# Patient Record
Sex: Female | Born: 1965 | Race: Black or African American | Hispanic: No | Marital: Married | Smoking: Never smoker
Health system: Southern US, Community
[De-identification: ages and names within clinical notes are randomized; demographics above are authoritative.]

## PROBLEM LIST (undated history)

## (undated) DIAGNOSIS — I10 Essential (primary) hypertension: Secondary | ICD-10-CM

## (undated) HISTORY — DX: Essential (primary) hypertension: I10

---

## 1999-06-07 HISTORY — PX: OTHER SURGICAL HISTORY: SHX169

## 2018-08-09 ENCOUNTER — Emergency Department (HOSPITAL_COMMUNITY)
Admission: EM | Admit: 2018-08-09 | Discharge: 2018-08-09 | Disposition: A | Discharge status: Home / Self Care | Payer: 59 | Attending: Emergency Medicine | Admitting: Emergency Medicine

## 2018-08-09 ENCOUNTER — Encounter (HOSPITAL_COMMUNITY): Payer: Self-pay

## 2018-08-09 ENCOUNTER — Other Ambulatory Visit: Payer: Self-pay

## 2018-08-09 ENCOUNTER — Emergency Department (HOSPITAL_COMMUNITY): Payer: 59

## 2018-08-09 DIAGNOSIS — D509 Iron deficiency anemia, unspecified: Secondary | ICD-10-CM

## 2018-08-09 DIAGNOSIS — I160 Hypertensive urgency: Secondary | ICD-10-CM | POA: Diagnosis not present

## 2018-08-09 DIAGNOSIS — D649 Anemia, unspecified: Secondary | ICD-10-CM | POA: Insufficient documentation

## 2018-08-09 DIAGNOSIS — R51 Headache: Secondary | ICD-10-CM | POA: Diagnosis present

## 2018-08-09 DIAGNOSIS — R202 Paresthesia of skin: Secondary | ICD-10-CM

## 2018-08-09 LAB — CBC WITH DIFFERENTIAL/PLATELET
Abs Immature Granulocytes: 0.01 10*3/uL (ref 0.00–0.07)
BASOS PCT: 1 %
Basophils Absolute: 0.1 10*3/uL (ref 0.0–0.1)
Eosinophils Absolute: 0.1 10*3/uL (ref 0.0–0.5)
Eosinophils Relative: 1 %
HCT: 26 % — ABNORMAL LOW (ref 36.0–46.0)
Hemoglobin: 7.2 g/dL — ABNORMAL LOW (ref 12.0–15.0)
Immature Granulocytes: 0 %
Lymphocytes Relative: 24 %
Lymphs Abs: 1.2 10*3/uL (ref 0.7–4.0)
MCH: 19 pg — ABNORMAL LOW (ref 26.0–34.0)
MCHC: 27.7 g/dL — ABNORMAL LOW (ref 30.0–36.0)
MCV: 68.8 fL — AB (ref 80.0–100.0)
Monocytes Absolute: 0.4 10*3/uL (ref 0.1–1.0)
Monocytes Relative: 8 %
Neutro Abs: 3.3 10*3/uL (ref 1.7–7.7)
Neutrophils Relative %: 66 %
Platelets: 428 10*3/uL — ABNORMAL HIGH (ref 150–400)
RBC: 3.78 MIL/uL — ABNORMAL LOW (ref 3.87–5.11)
RDW: 21.2 % — ABNORMAL HIGH (ref 11.5–15.5)
WBC: 5 10*3/uL (ref 4.0–10.5)
nRBC: 0 % (ref 0.0–0.2)

## 2018-08-09 LAB — COMPREHENSIVE METABOLIC PANEL
ALT: 10 U/L (ref 0–44)
AST: 19 U/L (ref 15–41)
Albumin: 3.1 g/dL — ABNORMAL LOW (ref 3.5–5.0)
Alkaline Phosphatase: 69 U/L (ref 38–126)
Anion gap: 4 — ABNORMAL LOW (ref 5–15)
BUN: 8 mg/dL (ref 6–20)
CHLORIDE: 110 mmol/L (ref 98–111)
CO2: 23 mmol/L (ref 22–32)
CREATININE: 0.74 mg/dL (ref 0.44–1.00)
Calcium: 8.6 mg/dL — ABNORMAL LOW (ref 8.9–10.3)
GFR calc Af Amer: >60 mL/min (ref >60)
Glucose, Bld: 100 mg/dL — ABNORMAL HIGH (ref 70–99)
Potassium: 3.4 mmol/L — ABNORMAL LOW (ref 3.5–5.1)
Sodium: 137 mmol/L (ref 135–145)
Total Bilirubin: 0.2 mg/dL — ABNORMAL LOW (ref 0.3–1.2)
Total Protein: 7.2 g/dL (ref 6.5–8.1)

## 2018-08-09 LAB — I-STAT TROPONIN, ED: Troponin i, poc: 0 ng/mL (ref 0.00–0.08)

## 2018-08-09 MED ORDER — ONDANSETRON HCL 4 MG/2ML IJ SOLN
4.0000 mg | Freq: Once | INTRAMUSCULAR | Status: AC
  Administered 2018-08-09: 4 mg via INTRAVENOUS
  Filled 2018-08-09: qty 2

## 2018-08-09 MED ORDER — LABETALOL HCL 5 MG/ML IV SOLN
10.0000 mg | Freq: Once | INTRAVENOUS | Status: AC
  Administered 2018-08-09: 10 mg via INTRAVENOUS
  Filled 2018-08-09: qty 4

## 2018-08-09 MED ORDER — MORPHINE SULFATE (PF) 4 MG/ML IV SOLN
4.0000 mg | Freq: Once | INTRAVENOUS | Status: AC
  Administered 2018-08-09: 4 mg via INTRAVENOUS
  Filled 2018-08-09: qty 1

## 2018-08-09 MED ORDER — FERROUS SULFATE 325 (65 FE) MG PO TABS: 325.0000 mg | ORAL_TABLET | Freq: Every day | ORAL | 0 refills | Status: AC

## 2018-08-09 MED ORDER — AMLODIPINE BESYLATE 5 MG PO TABS: 5.0000 mg | ORAL_TABLET | Freq: Every day | ORAL | 0 refills | Status: DC

## 2018-08-09 MED ORDER — HYDROCHLOROTHIAZIDE 25 MG PO TABS: 25.0000 mg | ORAL_TABLET | Freq: Every day | ORAL | 0 refills | Status: DC

## 2018-08-09 NOTE — ED Notes (Signed)
Patient verbalizes understanding of discharge instructions. Opportunity for questioning and answering were provided. Armband removed by staff , patient discharged from ED. 

## 2018-08-09 NOTE — ED Triage Notes (Addendum)
Pt brought in by ems for c/o sudden onset of right sided tingling that began around 0845 am today ; pt states it lasted about 30 min and went away ; pt now c/o right sided wrist pain ; pt denies any trauma to the area upon ems arrival , pt was hypertensive at 262/142 ; pt states she has never had high BP and doesn't take anything for it ; pt alert and oriented x 4 ; grips strong and equal; pt denies any further tingling

## 2018-08-09 NOTE — ED Notes (Addendum)
Greta Doom, PA aware og 246/164 BP and patient not wanting fecal occult ; per bowie , awaiting Ct scan and then will re evaluate Bp after CT scan; no further orders received at this time

## 2018-08-09 NOTE — ED Provider Notes (Signed)
[facility] EMERGENCY DEPARTMENT Provider Note   CSN: [id number] Arrival date & time: 08/09/18  1004    History   Chief Complaint Chief Complaint  Patient presents with  . Hypertension    HPI Savannah Randall is a 53 y.o. female.     The history is provided by the patient and the EMS personnel. No language interpreter was used.  Hypertension      53 year old female without any significant past medical history presenting via EMS from home for evaluation of right-sided tingling sensation.  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  She also noticed in sensation to her tongue as well.  This is new.  Patient pulled off on the side of the road and contacted EMS.  Her tingling sensation lasting for approximately 30 minutes and when EMS arrived, she was found to have high blood pressure at 262/142.  No history of hypertension in the past.  She felt that she was worked up from the tingling sensation which caused her high blood pressure.  This sensation has mostly resolved and now she is complaining of pain to her right wrist.  She denies any headache, vision changes, confusion, neck pain, chest pain, trouble breathing, abdominal pain, back pain denies any focal weakness.  She denies any increasing stress.  She denies alcohol or drug tobacco abuse.  She denies any recent injury.  No prior history of stroke.  History reviewed. No pertinent past medical history.  There are no active problems to display for this patient.   Past Surgical History:  Procedure Laterality Date  . 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      Home Medications    Prior to Admission medications   Not on File    Family History No family history on file.  Social History Social  History   Tobacco Use  . Smoking status: Never Smoker  . Smokeless tobacco: Never Used  Substance Use Topics  . Alcohol use: Not Currently  . Drug use: Never     Allergies   Benadryl allergy [diphenhydramine hcl]   Review of Systems Review of Systems  All other systems reviewed and are negative.    Physical Exam Updated Vital Signs BP (!) 195/114   Pulse 78   Temp 98 F (36.7 C) (Oral)   Resp 18   Ht 5\' 2"  (1.575 m)   Wt 61.2 kg   SpO2 98%   BMI 24.69 kg/m   Physical Exam Vitals signs and nursing note reviewed.  Constitutional:      General: She is not in acute distress.    Appearance: She is well-developed.  HENT:     Head: Normocephalic and atraumatic.     Nose: Nose normal.     Mouth/Throat:     Mouth: Mucous membranes are moist.  Eyes:     Extraocular Movements: Extraocular movements intact.     Conjunctiva/sclera: Conjunctivae normal.     Pupils: Pupils are equal, round, and reactive to light.  Neck:     Musculoskeletal: Normal range of motion and neck supple.  Cardiovascular:     Rate and Rhythm: Normal rate and regular rhythm.     Pulses: Normal pulses.     Heart sounds: Normal  heart sounds.  Pulmonary:     Breath sounds: Normal breath sounds. No wheezing, rhonchi or rales.  Abdominal:     Palpations: Abdomen is soft.     Tenderness: There is no abdominal tenderness.  Musculoskeletal:     Comments: 5 out of 5 strength all 4 extremities with intact sensation.  Mild tenderness when palpate right wrist with normal wrist flexion extension supination and pronation radial pulse 2+ brisk cap refill distally.  Skin:    Capillary Refill: Capillary refill takes less than 2 seconds.     Findings: No rash.  Neurological:     Mental Status: She is alert and oriented to person, place, and time.     GCS: GCS eye subscore is 4. GCS verbal subscore is 5. GCS motor subscore is 6.     Cranial Nerves: Cranial nerves are intact.     Sensory: Sensation is intact.       Motor: Motor function is intact.     Coordination: Coordination is intact.     Gait: Gait is intact.      ED Treatments / Results  Labs (all labs ordered are listed, but only abnormal results are displayed) Labs Reviewed  CBC WITH DIFFERENTIAL/PLATELET - Abnormal; Notable for the following components:      Result Value   RBC 3.78 (*)    Hemoglobin 7.2 (*)    HCT 26.0 (*)    MCV 68.8 (*)    MCH 19.0 (*)    MCHC 27.7 (*)    RDW 21.2 (*)    Platelets 428 (*)    All other components within normal limits  COMPREHENSIVE METABOLIC PANEL - Abnormal; Notable for the following components:   Potassium 3.4 (*)    Glucose, Bld 100 (*)    Calcium 8.6 (*)    Albumin 3.1 (*)    Total Bilirubin 0.2 (*)    Anion gap 4 (*)    All other components within normal limits  I-STAT TROPONIN, ED  POC OCCULT BLOOD, ED    EKG EKG Interpretation  Date/Time:  Thursday August 09 2018 10:12:59 EST Ventricular Rate:  79 PR Interval:    QRS Duration: 100 QT Interval:  402 QTC Calculation: 461 R Axis:   27 Text Interpretation:  Sinus rhythm Nonspecific T abnormalities, anterior leads No old tracing to compare Confirmed by Benjiman CorePickering, Nathan [phone]) on 08/09/2018 10:43:35 AM   Radiology Ct Head Wo Contrast  Result Date: 08/09/2018 CLINICAL DATA:  Sudden onset of RIGHT-sided tingling at 0845 hours today lasting 30 minutes then went away, now complaining of RIGHT wrist pain, hemiplegia EXAM: CT HEAD WITHOUT CONTRAST TECHNIQUE: Contiguous axial images were obtained from the base of the skull through the vertex without intravenous contrast. COMPARISON:  None FINDINGS: Brain: Cavum septum pellucidum. Otherwise normal ventricular morphology. No midline shift or mass effect. Normal appearance of brain parenchyma. No intracranial hemorrhage, mass lesion or evidence of acute infarction. No extra-axial fluid collections. Vascular: Unremarkable Skull: Intact Sinuses/Orbits: Clear Other: N/A IMPRESSION: No acute  intracranial abnormalities. Electronically Signed   By: Ulyses SouthwardMark  Boles M.D.   On: 08/09/2018 13:00    Procedures .Critical Care Performed by: Fayrene Helperran, Thadius Smisek, PA-C Authorized by: Fayrene Helperran, Jarriel Papillion, PA-C   Critical care provider statement:    Critical care time (minutes):  45   Critical care was time spent personally by me on the following activities:  Discussions with consultants, evaluation of patient's response to treatment, examination of patient, ordering and performing treatments and interventions, ordering and  review of laboratory studies, ordering and review of radiographic studies, pulse oximetry, re-evaluation of patient's condition, obtaining history from patient or surrogate and review of old charts   (including critical care time)  Medications Ordered in ED Medications  morphine 4 MG/ML injection 4 mg (4 mg Intravenous Given 08/09/18 1146)  ondansetron (ZOFRAN) injection 4 mg (4 mg Intravenous Given 08/09/18 1146)  labetalol (NORMODYNE,TRANDATE) injection 10 mg (10 mg Intravenous Given 08/09/18 1154)     Initial Impression / Assessment and Plan / ED Course  I have reviewed the triage vital signs and the nursing notes.  Pertinent labs & imaging results that were available during my care of the patient were reviewed by me and considered in my medical decision making (see chart for details).        BP (!) 144/98   Pulse 68   Temp 98 F (36.7 C) (Oral)   Resp 16   Ht 5\' 2"  (1.575 m)   Wt 61.2 kg   SpO2 100%   BMI 24.69 kg/m    Final Clinical Impressions(s) / ED Diagnoses   Final diagnoses:  Tingling in extremities  Hypertensive urgency  Iron deficiency anemia, unspecified iron deficiency anemia type    ED Discharge Orders         Ordered    hydrochlorothiazide (HYDRODIURIL) 25 MG tablet  Daily     08/09/18 1403    ferrous sulfate 325 (65 FE) MG tablet  Daily     08/09/18 1403         10:39 AM Patient report episode of tingling sensation to her right arm and right  leg lasting for approximately 30 minutes.  Currently she does not have any focal neuro deficit on exam, sensation is intact throughout, tenderness about the right wrist without any signs of injury.  Her initial blood pressure here is 195/114, on recheck it was 165 systolic without any specific treatment.  Patient otherwise well-appearing.  Work-up initiated.  1:57 PM Blood pressure has been fluctuating but did improve after the patient received labetalol.  Hemoglobin is 7.2, patient states she does have a known history of iron deficiency anemia and having been taking her iron supplementation as prescribed.  She denies any GI bleeding.  Offered Hemoccult with patient declined.  Her head CT scan is unremarkable, EKG and troponin unremarkable, normal renal function, and electrolyte panels are otherwise reassuring.  At this time, her symptoms mostly resolved, mild tenderness to right wrist only.  Patient will follow-up with PCP for further management.  Resources provided.  Blood pressure medication provided as well.  She does admits to previous history of hypertension and was on lisinopril but states that it causes her blood pressure to drop and therefore she now no longer take blood pressure medication.  Highest blood pressure today was 264/140, which has improved. Will d/c home with Norvasc 5mg  daily.  She will need close f/u with PCP. Care discussed with Dr. Rubin Payor.    Fayrene Helper, PA-C 08/09/18 1411    Benjiman Core, MD 08/09/18 [phone]

## 2018-08-09 NOTE — Discharge Instructions (Signed)
You have been evaluated for your tingling sensation.  Your head CT is normal.  You have low hemoglobin of 7.2. Please take iron supplementation and use number below to find a primary care provider for further management of your health.  Your blood pressure is high today, take blood pressure medication as prescribed.

## 2018-08-13 ENCOUNTER — Ambulatory Visit: Payer: 59 | Admitting: Family Medicine

## 2018-08-13 ENCOUNTER — Other Ambulatory Visit: Payer: Self-pay

## 2018-08-13 ENCOUNTER — Encounter: Payer: Self-pay | Admitting: Family Medicine

## 2018-08-13 DIAGNOSIS — I10 Essential (primary) hypertension: Secondary | ICD-10-CM | POA: Diagnosis not present

## 2018-08-13 DIAGNOSIS — D649 Anemia, unspecified: Secondary | ICD-10-CM

## 2018-08-13 MED ORDER — AMLODIPINE BESYLATE 5 MG PO TABS: 5.0000 mg | ORAL_TABLET | Freq: Every day | ORAL | 0 refills | Status: DC

## 2018-08-13 NOTE — Assessment & Plan Note (Signed)
-  taking iron supplement, continue and recheck in 8 weeks -Will need to be sure she has had colon cancer screening at f/u visit.

## 2018-08-13 NOTE — Patient Instructions (Addendum)
Continue amlodipine but try switching to taking in the evening.   Hypertension Hypertension, commonly called high blood pressure, is when the force of blood pumping through the arteries is too strong. The arteries are the blood vessels that carry blood from the heart throughout the body. Hypertension forces the heart to work harder to pump blood and may cause arteries to become narrow or stiff. Having untreated or uncontrolled hypertension can cause heart attacks, strokes, kidney disease, and other problems. A blood pressure reading consists of a higher number over a lower number. Ideally, your blood pressure should be below 120/80. The first ("top") number is called the systolic pressure. It is a measure of the pressure in your arteries as your heart beats. The second ("bottom") number is called the diastolic pressure. It is a measure of the pressure in your arteries as the heart relaxes. What are the causes? The cause of this condition is not known. What increases the risk? Some risk factors for high blood pressure are under your control. Others are not. Factors you can change  Smoking.  Having type 2 diabetes mellitus, high cholesterol, or both.  Not getting enough exercise or physical activity.  Being overweight.  Having too much fat, sugar, calories, or salt (sodium) in your diet.  Drinking too much alcohol. Factors that are difficult or impossible to change  Having chronic kidney disease.  Having a family history of high blood pressure.  Age. Risk increases with age.  Race. You may be at higher risk if you are African-American.  Gender. Men are at higher risk than women before age 29. After age 12, women are at higher risk than men.  Having obstructive sleep apnea.  Stress. What are the signs or symptoms? Extremely high blood pressure (hypertensive crisis) may cause:  Headache.  Anxiety.  Shortness of breath.  Nosebleed.  Nausea and vomiting.  Severe chest  pain.  Jerky movements you cannot control (seizures). How is this diagnosed? This condition is diagnosed by measuring your blood pressure while you are seated, with your arm resting on a surface. The cuff of the blood pressure monitor will be placed directly against the skin of your upper arm at the level of your heart. It should be measured at least twice using the same arm. Certain conditions can cause a difference in blood pressure between your right and left arms. Certain factors can cause blood pressure readings to be lower or higher than normal (elevated) for a short period of time:  When your blood pressure is higher when you are in a health care provider's office than when you are at home, this is called white coat hypertension. Most people with this condition do not need medicines.  When your blood pressure is higher at home than when you are in a health care provider's office, this is called masked hypertension. Most people with this condition may need medicines to control blood pressure. If you have a high blood pressure reading during one visit or you have normal blood pressure with other risk factors:  You may be asked to return on a different day to have your blood pressure checked again.  You may be asked to monitor your blood pressure at home for 1 week or longer. If you are diagnosed with hypertension, you may have other blood or imaging tests to help your health care provider understand your overall risk for other conditions. How is this treated? This condition is treated by making healthy lifestyle changes, such as eating healthy  foods, exercising more, and reducing your alcohol intake. Your health care provider may prescribe medicine if lifestyle changes are not enough to get your blood pressure under control, and if:  Your systolic blood pressure is above 130.  Your diastolic blood pressure is above 80. Your personal target blood pressure may vary depending on your medical  conditions, your age, and other factors. Follow these instructions at home: Eating and drinking   Eat a diet that is high in fiber and potassium, and low in sodium, added sugar, and fat. An example eating plan is called the DASH (Dietary Approaches to Stop Hypertension) diet. To eat this way: ? Eat plenty of fresh fruits and vegetables. Try to fill half of your plate at each meal with fruits and vegetables. ? Eat whole grains, such as whole wheat pasta, brown rice, or whole grain bread. Fill about one quarter of your plate with whole grains. ? Eat or drink low-fat dairy products, such as skim milk or low-fat yogurt. ? Avoid fatty cuts of meat, processed or cured meats, and poultry with skin. Fill about one quarter of your plate with lean proteins, such as fish, chicken without skin, beans, eggs, and tofu. ? Avoid premade and processed foods. These tend to be higher in sodium, added sugar, and fat.  Reduce your daily sodium intake. Most people with hypertension should eat less than 1,500 mg of sodium a day.  Limit alcohol intake to no more than 1 drink a day for nonpregnant women and 2 drinks a day for men. One drink equals 12 oz of beer, 5 oz of wine, or 1 oz of hard liquor. Lifestyle   Work with your health care provider to maintain a healthy body weight or to lose weight. Ask what an ideal weight is for you.  Get at least 30 minutes of exercise that causes your heart to beat faster (aerobic exercise) most days of the week. Activities may include walking, swimming, or biking.  Include exercise to strengthen your muscles (resistance exercise), such as pilates or lifting weights, as part of your weekly exercise routine. Try to do these types of exercises for 30 minutes at least 3 days a week.  Do not use any products that contain nicotine or tobacco, such as cigarettes and e-cigarettes. If you need help quitting, ask your health care provider.  Monitor your blood pressure at home as told by  your health care provider.  Keep all follow-up visits as told by your health care provider. This is important. Medicines  Take over-the-counter and prescription medicines only as told by your health care provider. Follow directions carefully. Blood pressure medicines must be taken as prescribed.  Do not skip doses of blood pressure medicine. Doing this puts you at risk for problems and can make the medicine less effective.  Ask your health care provider about side effects or reactions to medicines that you should watch for. Contact a health care provider if:  You think you are having a reaction to a medicine you are taking.  You have headaches that keep coming back (recurring).  You feel dizzy.  You have swelling in your ankles.  You have trouble with your vision. Get help right away if:  You develop a severe headache or confusion.  You have unusual weakness or numbness.  You feel faint.  You have severe pain in your chest or abdomen.  You vomit repeatedly.  You have trouble breathing. Summary  Hypertension is when the force of blood pumping through  your arteries is too strong. If this condition is not controlled, it may put you at risk for serious complications.  Your personal target blood pressure may vary depending on your medical conditions, your age, and other factors. For most people, a normal blood pressure is less than 120/80.  Hypertension is treated with lifestyle changes, medicines, or a combination of both. Lifestyle changes include weight loss, eating a healthy, low-sodium diet, exercising more, and limiting alcohol. This information is not intended to replace advice given to you by your health care provider. Make sure you discuss any questions you have with your health care provider. Document Released: 05/23/2005 Document Revised: 04/20/2016 Document Reviewed: 04/20/2016 Elsevier Interactive Patient Education  2019 [employer].

## 2018-08-13 NOTE — Assessment & Plan Note (Signed)
-  BP is much better controlled, continue amlodipine. -Discussed that feeling of dizziness will likely improve as her body adjusts to her new, normal blood pressure.  -She can try taking amlodipine before bed if causing fatigue during the day.

## 2018-08-13 NOTE — Progress Notes (Signed)
Savannah Randall - 53 y.o. female MRN [id number]  Date of birth: 03-08-1966  Subjective Chief Complaint  Patient presents with  . Follow-up    ED follow up, pt is feeling ok but is having some drowsyness and dizziness concerned about its the BP meds  . Establish Care    HPI Savannah Randall is a 53 y.o. female here today for initial visit with pcp after ER visit.  She was seen in ER on 08/09/2018 with complaint of R sided tingling.  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  She pulled the bus over and called EMS.  Per ED notes which were reviewed her BP was 262/142.  She was transported to the ED and found to have elevated BP there as well.  She was given labetalol with good response.  CT of head was normal.  Labs showed anemia and she is currently taking an iron supplement.  She was discharged with amlodipine.  She reports that she has felt a little dizzy at times and fatigued since starting amlodipine.  She denies further tingling sensation, chest pain,shortness of breath, palpitations, headache or vision changes.    ROS:  A comprehensive ROS was completed and negative except as noted per HPI  Allergies  Allergen Reactions  . Benadryl Allergy [Diphenhydramine Hcl] Anaphylaxis and Hives  . Mushroom Extract Complex Anaphylaxis and Nausea Only    Can't be near them at all    Past Medical History:  Diagnosis Date  . Hypertension     Past Surgical History:  Procedure Laterality Date  . 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 Marital status: Married    Spouse name: Not on file  . Number of children: Not on file  . Years of education: Not on file  . Highest education level: Not on file  Occupational History  . Not on file  Social Needs  . Financial resource strain: Not on file  . Food insecurity:    Worry: Not on file   Inability: Not on file  . Transportation needs:    Medical: Not on file    Non-medical: Not on file  Tobacco Use  . Smoking status: Never Smoker  . Smokeless tobacco: Never Used  Substance and Sexual Activity  . Alcohol use: Not Currently  . Drug use: Never  . Sexual activity: Not on file  Lifestyle  . Physical activity:    Days per week: Not on file    Minutes per session: Not on file  . Stress: Not on file  Relationships  . Social connections:    Talks on phone: Not on file    Gets together: Not on file    Attends religious service: Not on file    Active member of club or organization: Not on file    Attends meetings of clubs or organizations: Not on file    Relationship status: Not on file  Other Topics Concern  . Not on file  Social History Narrative  . Not on file    No family history on file.  Health Maintenance  Topic Date Due  . Hepatitis C Screening  November 12, 1965  . HIV Screening  07/28/1978  . TETANUS/TDAP  07/28/1982  . PAP SMEAR-Modifier  07/28/1984  . MAMMOGRAM  07/28/2013  . COLONOSCOPY  07/28/2013  .  INFLUENZA VACCINE  01/04/2018    ----------------------------------------------------------------------------------------------------------------------------------------------------------------------------------------------------------------- Physical Exam BP (!) 126/92 (BP Location: Right Arm, Patient Position: Sitting, Cuff Size: Normal)   Pulse 97   Temp 97.9 F (36.6 C) (Oral)   Ht 5\' 2"  (1.575 m)   Wt 157 lb 6.4 oz (71.4 kg)   SpO2 98%   BMI 28.79 kg/m   Physical Exam Constitutional:      Appearance: Normal appearance.  HENT:     Head: Normocephalic and atraumatic.     Right Ear: Tympanic membrane normal.     Left Ear: Tympanic membrane normal.     Nose: Nose normal.     Mouth/Throat:     Mouth: Mucous membranes are moist.  Eyes:     General: No scleral icterus. Neck:     Musculoskeletal: Neck supple.  Cardiovascular:     Rate and  Rhythm: Normal rate and regular rhythm.  Pulmonary:     Effort: Pulmonary effort is normal.  Skin:    General: Skin is warm and dry.  Neurological:     General: No focal deficit present.     Mental Status: She is alert.  Psychiatric:        Mood and Affect: Mood normal.        Behavior: Behavior normal.     ------------------------------------------------------------------------------------------------------------------------------------------------------------------------------------------------------------------- Assessment and Plan  Essential hypertension -BP is much better controlled, continue amlodipine. -Discussed that feeling of dizziness will likely improve as her body adjusts to her new, normal blood pressure.  -She can try taking amlodipine before bed if causing fatigue during the day.   Anemia -taking iron supplement, continue and recheck in 8 weeks -Will need to be sure she has had colon cancer screening at f/u visit.

## 2018-08-20 ENCOUNTER — Telehealth: Payer: Self-pay | Admitting: Family Medicine

## 2018-08-20 NOTE — Telephone Encounter (Signed)
Patient came into office and dropped off FMLA forms that need to be completed. Please call patient when forms are ready to be picked up, forms have been put in Dr. Nile Riggs folder in the front office for pick-up.

## 2018-08-21 NOTE — Telephone Encounter (Signed)
[employer] paperwork is being reviewed. Dr. Ashley Royalty received them 08/21/2018.

## 2018-08-24 NOTE — Telephone Encounter (Signed)
Pt called to f/u on FMLA paperwork. It is due before 08/30/2018. Please call with status update.   Ph# [phone]

## 2018-08-28 NOTE — Telephone Encounter (Signed)
Pt called to get the status of her FMLA paperwork and needs to have it before 3.26.20. 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

## 2018-08-28 NOTE — Telephone Encounter (Signed)
Called Pt . She will pick them up today.

## 2018-08-28 NOTE — Telephone Encounter (Signed)
Forms completed, ready to pick up.

## 2018-10-17 ENCOUNTER — Ambulatory Visit (INDEPENDENT_AMBULATORY_CARE_PROVIDER_SITE_OTHER): Payer: 59 | Admitting: Family Medicine

## 2018-10-17 ENCOUNTER — Encounter: Payer: Self-pay | Admitting: Family Medicine

## 2018-10-17 DIAGNOSIS — I10 Essential (primary) hypertension: Secondary | ICD-10-CM | POA: Diagnosis not present

## 2018-10-17 DIAGNOSIS — D649 Anemia, unspecified: Secondary | ICD-10-CM

## 2018-10-17 NOTE — Assessment & Plan Note (Signed)
-  BP well controlled based on home readings.   -Recommend continuation of current medications -Follow low salt diet.  -Recommend in office visit in 3-4 months for f/u of HTN.

## 2018-10-17 NOTE — Progress Notes (Signed)
Savannah Randall - 53 y.o. female MRN [id number]  Date of birth: 16-Dec-1965   This visit type was conducted due to national recommendations for restrictions regarding the COVID-19 Pandemic (e.g. social distancing).  This format is felt to be most appropriate for this patient at this time.  All issues noted in this document were discussed and addressed.  No physical exam was performed (except for noted visual exam findings with Video Visits).  I discussed the limitations of evaluation and management by telemedicine and the availability of in person appointments. The patient expressed understanding and agreed to proceed.  I connected with@ on 10/17/18 at 11:00 AM EDT by a video enabled telemedicine application and verified that I am speaking with the correct person using two identifiers.   Patient Location: Home [address] [place] [address]   Provider location:   [facility]  Chief Complaint  Patient presents with  . Follow-up    3 Mo F/U HTN     HPI  Savannah Randall is a 53 y.o. female who presents via audio/video conferencing for a telehealth visit today.  She is following up for HTN and anemia today.  She reports that she is doing well with current medications.  BP this morning 117/76.  She has had a few high readings but overall has been well controlled.  She denies any symptoms of dizziness, chest pain, shortness of breath, vision changes or headache.    Regarding her anemia she is not taking iron supplement due to constipation.  Has not tried taking with stool softener or changing type or iron supplement.    ROS:  A comprehensive ROS was completed and negative except as noted per HPI  Past Medical History:  Diagnosis Date  . Hypertension     Past Surgical History:  Procedure Laterality Date  . 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  Social History   Socioeconomic History  . Marital status:  Married    Spouse name: Not on file  . Number of children: Not on file  . Years of education: Not on file  . Highest education level: Not on file  Occupational History  . Not on file  Social Needs  . Financial resource strain: Not on file  . Food insecurity:    Worry: Not on file    Inability: Not on file  . Transportation needs:    Medical: Not on file    Non-medical: Not on file  Tobacco Use  . Smoking status: Never Smoker  . Smokeless tobacco: Never Used  Substance and Sexual Activity  . Alcohol use: Not Currently  . Drug use: Never  . Sexual activity: Not on file  Lifestyle  . Physical activity:    Days per week: Not on file    Minutes per session: Not on file  . Stress: Not on file  Relationships  . Social connections:    Talks on phone: Not on file    Gets together: Not on file    Attends religious service: Not on file    Active member of club or organization: Not on file    Attends meetings of clubs or organizations: Not on file    Relationship status: Not on file  . Intimate partner violence:    Fear of current or ex partner: Not on file    Emotionally abused: Not on file    Physically abused: Not  on file    Forced sexual activity: Not on file  Other Topics Concern  . Not on file  Social History Narrative  . Not on file     Current Outpatient Medications:  .  amLODipine (NORVASC) 5 MG tablet, Take 1 tablet (5 mg total) by mouth daily., Disp: 90 tablet, Rfl: 0 .  ferrous sulfate 325 (65 FE) MG tablet, Take 1 tablet (325 mg total) by mouth daily., Disp: 30 tablet, Rfl: 0 .  ibuprofen (ADVIL,MOTRIN) 200 MG tablet, Take 400 mg by mouth every 6 (six) hours as needed for cramping., Disp: , Rfl:   EXAM:  VITALS per patient if applicable: BP 117/76 Comment: wrist BP cuff @ hm  Pulse 77   Ht 5\' 1"  (1.549 m)   Wt 162 lb (73.5 kg)   BMI 30.61 kg/m   GENERAL: alert, oriented, appears well and in no acute distress  HEENT: atraumatic, conjunttiva clear, no  obvious abnormalities on inspection of external nose and ears  NECK: normal movements of the head and neck  LUNGS: on inspection no signs of respiratory distress, breathing rate appears normal, no obvious gross SOB, gasping or wheezing  CV: no obvious cyanosis  MS: moves all visible extremities without noticeable abnormality  PSYCH/NEURO: pleasant and cooperative, no obvious depression or anxiety, speech and thought processing grossly intact  ASSESSMENT AND PLAN:  Discussed the following assessment and plan:  Essential hypertension -BP well controlled based on home readings.   -Recommend continuation of current medications -Follow low salt diet.  -Recommend in office visit in 3-4 months for f/u of HTN.  Anemia -Discussed trying iron with stool softener or trying SlowFe.          I discussed the assessment and treatment plan with the patient. The patient was provided an opportunity to ask questions and all were answered. The patient agreed with the plan and demonstrated an understanding of the instructions.   The patient was advised to call back or seek an in-person evaluation if the symptoms worsen or if the condition fails to improve as anticipated.     Everrett Coombeody Dashea Mcmullan, DO

## 2018-10-17 NOTE — Assessment & Plan Note (Signed)
-  Discussed trying iron with stool softener or trying SlowFe.

## 2018-11-29 ENCOUNTER — Other Ambulatory Visit: Payer: Self-pay | Admitting: Family Medicine

## 2018-11-29 MED ORDER — AMLODIPINE BESYLATE 5 MG PO TABS: 5.0000 mg | ORAL_TABLET | Freq: Every day | ORAL | 1 refills | Status: DC

## 2018-11-29 NOTE — Telephone Encounter (Signed)
Rx sent 

## 2018-11-29 NOTE — Telephone Encounter (Signed)
Medication Refill - Medication: amLODipine (NORVASC) 5 MG tablet    Has the patient contacted their pharmacy? Yes.   (Agent: If no, request that the patient contact the pharmacy for the refill.) (Agent: If yes, when and what did the pharmacy advise?)  Preferred Pharmacy (with phone number or street name):  [facility], [address]  [address] [place] [address]  Phone: [phone] Fax: [phone]  Not a 24 hour pharmacy; exact hours not known.     Agent: Please be advised that RX refills may take up to 3 business days. We ask that you follow-up with your pharmacy.

## 2019-01-09 ENCOUNTER — Ambulatory Visit (INDEPENDENT_AMBULATORY_CARE_PROVIDER_SITE_OTHER): Payer: 59 | Admitting: Family Medicine

## 2019-01-09 ENCOUNTER — Encounter: Payer: Self-pay | Admitting: Family Medicine

## 2019-01-09 DIAGNOSIS — I10 Essential (primary) hypertension: Secondary | ICD-10-CM

## 2019-01-09 NOTE — Assessment & Plan Note (Signed)
-  BP is well controlled at this time.  She will continue amlodipine with follow up in 6 months.  -Letter provided for DOT physical.

## 2019-01-09 NOTE — Progress Notes (Signed)
Savannah Randall - 53 y.o. female MRN [id number]  Date of birth: February 04, 1966  Subjective Chief Complaint  Patient presents with  . 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 female with history of HTN here today for follow up.  She needs a letter for upcoming DOT physical regarding her BP.  She continues to do well with amlodipine and denies side effects.  She has not had any anginal symptoms, headache , vision changes or dizziness since starting medication.    ROS:  A comprehensive ROS was completed and negative except as noted per HPI  Allergies  Allergen Reactions  . Benadryl Allergy [Diphenhydramine Hcl] Anaphylaxis and Hives  . Mushroom Extract Complex Anaphylaxis and Nausea Only    Can't be near them at all    Past Medical History:  Diagnosis Date  . Hypertension     Past Surgical History:  Procedure Laterality Date  . 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 Marital status: Married    Spouse name: Not on file  . Number of children: Not on file  . Years of education: Not on file  . Highest education level: Not on file  Occupational History  . Not on file  Social Needs  . Financial resource strain: Not on file  . Food insecurity    Worry: Not on file    Inability: Not on file  . Transportation needs    Medical: Not on file    Non-medical: Not on file  Tobacco Use  . Smoking status: Never Smoker  . Smokeless tobacco: Never Used  Substance and Sexual Activity  . Alcohol use: Not Currently  . Drug use: Never  . Sexual activity: Not on file  Lifestyle  . Physical activity    Days per week: Not on file    Minutes per session: Not on file  . Stress: Not on file  Relationships  . Social Herbalist on phone: Not on file    Gets together: Not on file    Attends religious service: Not on file   Active member of club or organization: Not on file    Attends meetings of clubs or organizations: Not on file    Relationship status: Not on file  Other Topics Concern  . Not on file  Social History Narrative  . Not on file    No family history on file.  Health Maintenance  Topic Date Due  . Hepatitis C Screening  11/17/1965  . HIV Screening  07/28/1978  . TETANUS/TDAP  07/28/1982  . PAP SMEAR-Modifier  07/28/1984  . MAMMOGRAM  07/28/2013  . COLONOSCOPY  07/28/2013  . INFLUENZA VACCINE  01/05/2019    ----------------------------------------------------------------------------------------------------------------------------------------------------------------------------------------------------------------- Physical Exam BP 122/90   Pulse 85   Temp (!) 97.4 F (36.3 C) (Oral)   Ht 5\' 1"  (1.549 m)   Wt 169 lb 9.6 oz (76.9 kg)   SpO2 97%   BMI 32.05 kg/m   Physical Exam Constitutional:      Appearance: Normal appearance.  HENT:     Head: Normocephalic and atraumatic.  Neck:     Musculoskeletal: Neck supple.  Cardiovascular:     Rate and Rhythm: Normal rate and regular rhythm.  Pulmonary:     Effort: Pulmonary effort is normal.  Breath sounds: Normal breath sounds.  Skin:    General: Skin is warm and dry.  Neurological:     General: No focal deficit present.     Mental Status: She is alert.  Psychiatric:        Mood and Affect: Mood normal.        Behavior: Behavior normal.     ------------------------------------------------------------------------------------------------------------------------------------------------------------------------------------------------------------------- Assessment and Plan  Essential hypertension -BP is well controlled at this time.  She will continue amlodipine with follow up in 6 months.  -Letter provided for DOT physical.

## 2019-01-10 ENCOUNTER — Telehealth: Payer: Self-pay | Admitting: Family Medicine

## 2019-01-10 NOTE — Telephone Encounter (Signed)
Tried to call pt to get more information regarding medical record release, had to leave a message

## 2019-07-19 ENCOUNTER — Other Ambulatory Visit: Payer: Self-pay

## 2019-07-19 ENCOUNTER — Encounter (HOSPITAL_COMMUNITY): Payer: Self-pay

## 2019-07-19 ENCOUNTER — Emergency Department (HOSPITAL_COMMUNITY)
Admission: EM | Admit: 2019-07-19 | Discharge: 2019-07-19 | Disposition: A | Discharge status: Home / Self Care | Payer: No Typology Code available for payment source | Attending: Emergency Medicine | Admitting: Emergency Medicine

## 2019-07-19 ENCOUNTER — Emergency Department (HOSPITAL_COMMUNITY): Payer: No Typology Code available for payment source

## 2019-07-19 DIAGNOSIS — R109 Unspecified abdominal pain: Secondary | ICD-10-CM | POA: Insufficient documentation

## 2019-07-19 DIAGNOSIS — Y92410 Unspecified street and highway as the place of occurrence of the external cause: Secondary | ICD-10-CM | POA: Insufficient documentation

## 2019-07-19 DIAGNOSIS — R55 Syncope and collapse: Secondary | ICD-10-CM | POA: Insufficient documentation

## 2019-07-19 DIAGNOSIS — Y9389 Activity, other specified: Secondary | ICD-10-CM | POA: Insufficient documentation

## 2019-07-19 DIAGNOSIS — Z79899 Other long term (current) drug therapy: Secondary | ICD-10-CM | POA: Diagnosis not present

## 2019-07-19 DIAGNOSIS — I10 Essential (primary) hypertension: Secondary | ICD-10-CM | POA: Insufficient documentation

## 2019-07-19 DIAGNOSIS — S161XXA Strain of muscle, fascia and tendon at neck level, initial encounter: Secondary | ICD-10-CM | POA: Diagnosis not present

## 2019-07-19 DIAGNOSIS — R079 Chest pain, unspecified: Secondary | ICD-10-CM | POA: Diagnosis not present

## 2019-07-19 DIAGNOSIS — Y999 Unspecified external cause status: Secondary | ICD-10-CM | POA: Diagnosis not present

## 2019-07-19 DIAGNOSIS — S199XXA Unspecified injury of neck, initial encounter: Secondary | ICD-10-CM | POA: Diagnosis present

## 2019-07-19 LAB — BASIC METABOLIC PANEL
Anion gap: 9 (ref 5–15)
BUN: 13 mg/dL (ref 6–20)
CO2: 24 mmol/L (ref 22–32)
Calcium: 9.2 mg/dL (ref 8.9–10.3)
Chloride: 105 mmol/L (ref 98–111)
Creatinine, Ser: 0.9 mg/dL (ref 0.44–1.00)
GFR calc Af Amer: >60 mL/min (ref >60)
GFR calc non Af Amer: >60 mL/min (ref >60)
Glucose, Bld: 151 mg/dL — ABNORMAL HIGH (ref 70–99)
Potassium: 3.9 mmol/L (ref 3.5–5.1)
Sodium: 138 mmol/L (ref 135–145)

## 2019-07-19 LAB — CBC
HCT: 40.4 % (ref 36.0–46.0)
Hemoglobin: 13 g/dL (ref 12.0–15.0)
MCH: 29 pg (ref 26.0–34.0)
MCHC: 32.2 g/dL (ref 30.0–36.0)
MCV: 90.2 fL (ref 80.0–100.0)
Platelets: 307 10*3/uL (ref 150–400)
RBC: 4.48 MIL/uL (ref 3.87–5.11)
RDW: 13.5 % (ref 11.5–15.5)
WBC: 7.5 10*3/uL (ref 4.0–10.5)
nRBC: 0 % (ref 0.0–0.2)

## 2019-07-19 MED ORDER — IOHEXOL 300 MG/ML  SOLN
100.0000 mL | Freq: Once | INTRAMUSCULAR | Status: AC | PRN
  Administered 2019-07-19: 09:00:00 100 mL via INTRAVENOUS

## 2019-07-19 MED ORDER — METHOCARBAMOL 500 MG PO TABS: 500.0000 mg | ORAL_TABLET | Freq: Three times a day (TID) | ORAL | 0 refills | Status: DC | PRN

## 2019-07-19 NOTE — ED Notes (Signed)
Patient verbalizes understanding of discharge instructions. Opportunity for questioning and answers were provided. Pt discharged from ED. 
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
## 2019-07-19 NOTE — ED Notes (Signed)
Pt ambulatory to and from restroom with steady gait 

## 2019-07-22 ENCOUNTER — Ambulatory Visit (INDEPENDENT_AMBULATORY_CARE_PROVIDER_SITE_OTHER): Payer: 59 | Admitting: Family Medicine

## 2019-07-22 ENCOUNTER — Encounter: Payer: Self-pay | Admitting: Family Medicine

## 2019-07-22 DIAGNOSIS — S161XXA Strain of muscle, fascia and tendon at neck level, initial encounter: Secondary | ICD-10-CM

## 2019-07-22 DIAGNOSIS — I10 Essential (primary) hypertension: Secondary | ICD-10-CM

## 2019-07-22 MED ORDER — AMLODIPINE BESYLATE 10 MG PO TABS: 10.0000 mg | ORAL_TABLET | Freq: Every day | ORAL | 1 refills | Status: DC

## 2019-07-22 MED ORDER — MELOXICAM 7.5 MG PO TABS: ORAL_TABLET | ORAL | 0 refills | Status: DC

## 2019-07-22 MED ORDER — TRAMADOL HCL 50 MG PO TABS: 50.0000 mg | ORAL_TABLET | Freq: Three times a day (TID) | ORAL | 0 refills | Status: AC | PRN
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
## 2019-07-23 ENCOUNTER — Telehealth: Payer: Self-pay | Admitting: Family Medicine

## 2019-07-23 DIAGNOSIS — S161XXA Strain of muscle, fascia and tendon at neck level, initial encounter: Secondary | ICD-10-CM | POA: Insufficient documentation

## 2019-07-23 NOTE — Telephone Encounter (Signed)
Patient called and wanted to know if you were going to write her a letter to be out of work until you see her in 2 weeks. She did not say it was anything specific in the letter. She reports that we have FMLA that was dropped off yesterday but she would be able to pull a letter to keep her out work temporarily. Please advise.

## 2019-07-23 NOTE — Telephone Encounter (Signed)
Letter completed and placed in assistant box.

## 2019-07-23 NOTE — Assessment & Plan Note (Signed)
BP is not well controlled at this time.  Will increase amlodipine to 10mg  daily. Return in about 2 weeks (around 08/05/2019) for HTN.
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
## 2019-07-23 NOTE — Telephone Encounter (Signed)
I called patient and she is aware that the letter was placed up front for pickup. She did not have any questions.
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
## 2019-08-02 NOTE — Telephone Encounter (Signed)
FMLA paperwork was placed in the basket. I have sent a copy to scan. The patient has a follow up on 08/05/2019 and will get at that time.

## 2019-08-05 ENCOUNTER — Encounter: Payer: Self-pay | Admitting: Family Medicine

## 2019-08-05 ENCOUNTER — Ambulatory Visit (INDEPENDENT_AMBULATORY_CARE_PROVIDER_SITE_OTHER): Payer: 59 | Admitting: Family Medicine

## 2019-08-05 ENCOUNTER — Other Ambulatory Visit: Payer: Self-pay

## 2019-08-05 DIAGNOSIS — I10 Essential (primary) hypertension: Secondary | ICD-10-CM | POA: Diagnosis not present

## 2019-08-05 DIAGNOSIS — S161XXA Strain of muscle, fascia and tendon at neck level, initial encounter: Secondary | ICD-10-CM

## 2019-08-05 NOTE — Patient Instructions (Signed)
Continue current medications.  Let me know if headaches are not improving after seeing chiropractor for a few visits.   Fax updated FMLA paperwork to: [phone]

## 2019-08-05 NOTE — Assessment & Plan Note (Signed)
Overall improved some.  Still having some headaches.  Starting with chiropractor soon.  She will let me know if not improving after a few sessions.
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
## 2019-08-05 NOTE — Assessment & Plan Note (Signed)
Blood pressure is at goal at for age and co-morbidities.  I recommend she continue amlodipine at current dose. In addition they were instructed to follow a low sodium diet with regular exercise to help to maintain adequate control of blood pressure.  ° °

## 2019-08-09 ENCOUNTER — Telehealth: Payer: Self-pay | Admitting: Family Medicine

## 2019-08-09 NOTE — Telephone Encounter (Signed)
Patient called and wanted to check the status of the updated FMLA paperwork. I called her to let her know that PCP has the forms and we will call her to let her know when its ready for pick up. FYI.

## 2019-08-12 NOTE — Telephone Encounter (Signed)
Per PCP it has been completed and it is ready for pick up. She will stop by the office to pick up. A copy has been placed to scan in patients chart. No other questions.

## 2019-09-16 ENCOUNTER — Other Ambulatory Visit: Payer: Self-pay | Admitting: Family Medicine

## 2019-09-16 ENCOUNTER — Telehealth: Payer: Self-pay | Admitting: Family Medicine

## 2019-09-16 MED ORDER — FEXOFENADINE-PSEUDOEPHED ER 180-240 MG PO TB24: 1.0000 | ORAL_TABLET | Freq: Every day | ORAL | 1 refills | Status: AC

## 2019-09-16 NOTE — Telephone Encounter (Signed)
Patient reports that the prescrption is going to be a lot cheaper. Please send to pharmacy on file.

## 2019-09-16 NOTE — Telephone Encounter (Signed)
Patient called and wants to know if she would need an OV or if you could send in a prescription for Allegra-D. Patient reports her allergies and the pollen is making her sneeze. She is wanting a prescription to help her feel better since OTC is not helping. Please advise. Pharmacy on file is correct.

## 2019-09-16 NOTE — Telephone Encounter (Signed)
Allegra-D is available OTC

## 2019-09-16 NOTE — Telephone Encounter (Signed)
Allegra D ordered.

## 2019-09-20 NOTE — Telephone Encounter (Signed)
Patient called and wants a prescription for something for allergies and I talked with PCP. He reports that most all allergy medications are over the counter are not available in prescription form. She reports that the pharmacy tried to charge her for 12 pills of something that she could get over the counter cheaper. She is going to price the medication at other places. No other concerns.

## 2019-10-31 ENCOUNTER — Encounter: Payer: Self-pay | Admitting: Family Medicine

## 2019-10-31 ENCOUNTER — Telehealth (INDEPENDENT_AMBULATORY_CARE_PROVIDER_SITE_OTHER): Payer: 59 | Admitting: Family Medicine

## 2019-10-31 DIAGNOSIS — R110 Nausea: Secondary | ICD-10-CM | POA: Diagnosis not present

## 2019-10-31 NOTE — Assessment & Plan Note (Signed)
Acute GI illness, viral vs food related.  This will likely resolve on its own.   She should stay well hydrated.   Note provided to return to work next week after symptoms resolve.  She will call for any new or worsening symptoms.

## 2019-10-31 NOTE — Progress Notes (Signed)
Ate mayonnaise causing stomach upset.

## 2019-10-31 NOTE — Progress Notes (Signed)
Savannah Randall - 54 y.o. female MRN [id number]  Date of birth: 1965-08-02   This visit type was conducted due to national recommendations for restrictions regarding the COVID-19 Pandemic (e.g. social distancing).  This format is felt to be most appropriate for this patient at this time.  All issues noted in this document were discussed and addressed.  No physical exam was performed (except for noted visual exam findings with Video Visits).  I discussed the limitations of evaluation and management by telemedicine and the availability of in person appointments. The patient expressed understanding and agreed to proceed.  I connected with@ on 10/31/19 at 10:50 AM EDT by a video enabled telemedicine application and verified that I am speaking with the correct person using two identifiers.  Interactive audio and video telecommunications were attempted between this provider and patient, however failed, due to patient having technical difficulties OR patient did not have access to video capability.  We continued and completed visit with audio only.    Present at visit: Everrett Coombe, DO Luther Redo   Patient Location: Home [address] [place] [address]   Provider location:   [facility]  Chief Complaint  Patient presents with  . Abdominal Pain    HPI  Savannah Randall is a 54 y.o. female who presents via audio/video conferencing for a telehealth visit today.  She has complaint of nausea, abdominal cramping and diarrhea.  Symptoms started yesterday.  Thinks this may be related to eating something with mayonnaise. Has had similar symptoms when eating something with mayo in the past.  She denies blood in her stool, vomiting, fever, or chills.  She has abstained from work due to symptoms and requests a note for this.     ROS:  A comprehensive ROS was completed and negative except as noted per HPI  Past Medical History:  Diagnosis Date  . Hypertension     Past Surgical History:   Procedure Laterality Date  . 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  Social History   Socioeconomic History  . Marital status: Married    Spouse name: Not on file  . Number of children: Not on file  . Years of education: Not on file  . Highest education level: Not on file  Occupational History  . Not on file  Tobacco Use  . Smoking status: Never Smoker  . Smokeless tobacco: Never Used  Substance and Sexual Activity  . Alcohol use: Not Currently  . Drug use: Never  . Sexual activity: Not on file  Other Topics Concern  . Not on file  Social History Narrative  . Not on file   Social Determinants of Health   Financial Resource Strain:   . Difficulty of Paying Living Expenses:   Food Insecurity:   . Worried About Programme researcher, broadcasting/film/video in the Last Year:   . Barista in the Last Year:   Transportation Needs:   . Freight forwarder (Medical):   Marland Kitchen Lack of Transportation (Non-Medical):   Physical Activity:   . Days of Exercise per Week:   . Minutes of Exercise per Session:   Stress:   . Feeling of Stress :   Social Connections:   . Frequency of Communication with Friends and Family:   . Frequency of Social Gatherings with Friends and Family:   . Attends Religious Services:   . Active Member of Clubs  or Organizations:   . Attends Archivist Meetings:   Marland Kitchen Marital Status:   Intimate Partner Violence:   . Fear of Current or Ex-Partner:   . Emotionally Abused:   Marland Kitchen Physically Abused:   . Sexually Abused:      Current Outpatient Medications:  .  amLODipine (NORVASC) 10 MG tablet, Take 1 tablet (10 mg total) by mouth daily., Disp: 90 tablet, Rfl: 1 .  fexofenadine-pseudoephedrine (ALLEGRA-D ALLERGY & CONGESTION) 180-240 MG 24 hr tablet, Take 1 tablet by mouth daily., Disp: 90 tablet, Rfl: 1 .  methocarbamol (ROBAXIN) 500 MG tablet, Take 1 tablet (500 mg total) by mouth every 8 (eight) hours as  needed for muscle spasms., Disp: 8 tablet, Rfl: 0 .  ferrous sulfate 325 (65 FE) MG tablet, Take 1 tablet (325 mg total) by mouth daily. (Patient not taking: Reported on 10/31/2019), Disp: 30 tablet, Rfl: 0 .  ibuprofen (ADVIL,MOTRIN) 200 MG tablet, Take 400 mg by mouth every 6 (six) hours as needed for cramping., Disp: , Rfl:   EXAM:  VITALS per patient if applicable: BP (!) 948/01   Pulse 72   Wt 160 lb (72.6 kg)   BMI 30.23 kg/m   GENERAL: alert, oriented,and in no acute distress   PSYCH/NEURO: pleasant and cooperative, no obvious depression or anxiety, speech and thought processing grossly intact  ASSESSMENT AND PLAN:  Discussed the following assessment and plan:  Nausea Acute GI illness, viral vs food related.  This will likely resolve on its own.   She should stay well hydrated.   Note provided to return to work next week after symptoms resolve.  She will call for any new or worsening symptoms.   20 minutes spent including pre visit preparation, review of prior notes and labs, encounter with patient via video visit and same day documentation.     I discussed the assessment and treatment plan with the patient. The patient was provided an opportunity to ask questions and all were answered. The patient agreed with the plan and demonstrated an understanding of the instructions.   The patient was advised to call back or seek an in-person evaluation if the symptoms worsen or if the condition fails to improve as anticipated.    Luetta Nutting, DO

## 2019-11-12 ENCOUNTER — Telehealth: Payer: Self-pay

## 2019-11-12 NOTE — Telephone Encounter (Signed)
Savannah Randall lvm requesting a printout of office visit dates 08/05/19 & 07/22/19.  Printed office notes for the requested dates. Called patient concerning how she would receive the information.   Patient requested documents be faxed to Retta Mac (her attorneys).  We requested the patient pick-up the documentation since no release of information had been signed concerning Retta Mac.   Documentation left at [facility] in envelope for pickup by Savannah Randall.

## 2020-01-07 ENCOUNTER — Telehealth: Payer: Self-pay

## 2020-01-07 NOTE — Telephone Encounter (Signed)
Pt called stating Employee Health found protein in urine.   Completing CDL renewal.   4% of protein in urine.  Advised patient to schedule appointment.

## 2020-07-15 ENCOUNTER — Ambulatory Visit (INDEPENDENT_AMBULATORY_CARE_PROVIDER_SITE_OTHER): Payer: 59

## 2020-07-15 ENCOUNTER — Other Ambulatory Visit: Payer: Self-pay

## 2020-07-15 ENCOUNTER — Ambulatory Visit (INDEPENDENT_AMBULATORY_CARE_PROVIDER_SITE_OTHER): Payer: 59 | Admitting: Family Medicine

## 2020-07-15 ENCOUNTER — Encounter: Payer: Self-pay | Admitting: Family Medicine

## 2020-07-15 VITALS — BP 159/107 | HR 92 | Wt 180.6 lb

## 2020-07-15 DIAGNOSIS — G8929 Other chronic pain: Secondary | ICD-10-CM

## 2020-07-15 DIAGNOSIS — M545 Low back pain, unspecified: Secondary | ICD-10-CM

## 2020-07-15 DIAGNOSIS — I10 Essential (primary) hypertension: Secondary | ICD-10-CM | POA: Diagnosis not present

## 2020-07-15 MED ORDER — NAPROXEN 500 MG PO TABS: 500.0000 mg | ORAL_TABLET | Freq: Two times a day (BID) | ORAL | 0 refills | Status: DC
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
## 2020-07-15 NOTE — Assessment & Plan Note (Signed)
BP elevated today.  Denies dizziness at this time.  Readings at home have been well controlled when not having back pain.   Continue amlodipine and continue home monitoring.

## 2020-07-23 ENCOUNTER — Telehealth: Payer: Self-pay

## 2020-07-23 ENCOUNTER — Encounter: Payer: Self-pay | Admitting: Rehabilitative and Restorative Service Providers"

## 2020-07-23 ENCOUNTER — Ambulatory Visit (INDEPENDENT_AMBULATORY_CARE_PROVIDER_SITE_OTHER): Payer: 59 | Admitting: Rehabilitative and Restorative Service Providers"

## 2020-07-23 ENCOUNTER — Other Ambulatory Visit: Payer: Self-pay

## 2020-07-23 DIAGNOSIS — M6281 Muscle weakness (generalized): Secondary | ICD-10-CM

## 2020-07-23 DIAGNOSIS — G8929 Other chronic pain: Secondary | ICD-10-CM | POA: Diagnosis not present

## 2020-07-23 DIAGNOSIS — R29898 Other symptoms and signs involving the musculoskeletal system: Secondary | ICD-10-CM | POA: Diagnosis not present

## 2020-07-23 DIAGNOSIS — M545 Low back pain, unspecified: Secondary | ICD-10-CM

## 2020-07-23 NOTE — Patient Instructions (Addendum)
TENS UNIT: This is helpful for muscle pain and spasm.   Search and Purchase a TENS 7000 2nd edition at www.tenspros.com. It should be less than $30.     TENS unit instructions: Do not shower or bathe with the unit on Turn the unit off before removing electrodes or batteries If the electrodes lose stickiness add a drop of water to the electrodes after they are disconnected from the unit and place on plastic sheet. If you continued to have difficulty, call the TENS unit company to purchase more electrodes. Do not apply lotion on the skin area prior to use. Make sure the skin is clean and dry as this will help prolong the life of the electrodes. After use, always check skin for unusual red areas, rash or other skin difficulties. If there are any skin problems, does not apply electrodes to the same area. Never remove the electrodes from the unit by pulling the wires. Do not use the TENS unit or electrodes other than as directed. Do not change electrode placement without consultating your therapist or physician. Keep 2 fingers with between each electrode.  Access Code: [email or web address]: [email or web address]: 02/17/2022Prepared by: Bennett Ram HoltExercises  Supine Double Knee to Chest - 2 x daily - 7 x weekly - 1 sets - 3 reps - 30 sec hold  Supine Lower Trunk Rotation - 2 x daily - 7 x weekly - 1 sets - 3-5 reps - 20-30 sec hold  Supine Transversus Abdominis Bracing with Pelvic Floor Contraction - 2 x daily - 7 x weekly - 1 sets - 10 reps - 10sec hold  Sit to Stand - 2 x daily - 7 x weekly - 1 sets - 10 reps - 3-5 sec hold

## 2020-07-23 NOTE — Telephone Encounter (Signed)
Pt came by today to pick up FMLA paperwork that was dropped off on 07/15/2020 when she was last seen and stated that she was going out of town for vacation and needed to have the paperwork. Pt would like to be notified when paperwork is completed @ [phone]
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
## 2020-07-24 NOTE — Telephone Encounter (Signed)
Attempted to contact patient for document pick-up. No answer. VM not setup. Advised Savannah Randall to continue to contact pt for pickup.

## 2020-07-24 NOTE — Telephone Encounter (Signed)
Completed and placed in assistant's box.

## 2020-07-24 NOTE — Telephone Encounter (Signed)
Documents scanned into chart. Patient contacted for pick-up

## 2020-07-24 NOTE — Telephone Encounter (Signed)
Called 3x. No answer and no VM set up. Will continue to try to reach out to patient letting her know that her paperwork is ready for pick up. Paperwork has been scanned into pt's chart. AM

## 2020-07-27 NOTE — Telephone Encounter (Signed)
Attempted to call a few more times this morning, trying to let patient know that her paperwork is ready for pick up. No answer and no VM set up to leave a message. Will continue through out the day to reach patient. AM

## 2020-07-27 NOTE — Telephone Encounter (Signed)
Patient finally came in to pick up paperwork. AM

## 2020-07-30 ENCOUNTER — Encounter: Payer: 59 | Admitting: Rehabilitative and Restorative Service Providers"

## 2020-07-31 ENCOUNTER — Encounter: Payer: 59 | Admitting: Rehabilitative and Restorative Service Providers"

## 2020-08-06 ENCOUNTER — Encounter: Payer: 59 | Admitting: Rehabilitative and Restorative Service Providers"

## 2020-08-07 ENCOUNTER — Encounter: Payer: 59 | Admitting: Rehabilitative and Restorative Service Providers"

## 2020-08-13 ENCOUNTER — Encounter: Payer: 59 | Admitting: Physical Therapy

## 2020-08-14 ENCOUNTER — Encounter: Payer: 59 | Admitting: Rehabilitative and Restorative Service Providers"

## 2020-08-20 ENCOUNTER — Telehealth: Payer: Self-pay | Admitting: Rehabilitative and Restorative Service Providers"

## 2020-08-20 ENCOUNTER — Encounter: Payer: 59 | Admitting: Rehabilitative and Restorative Service Providers"

## 2020-08-20 NOTE — Telephone Encounter (Signed)
Patient failed to show for scheduled PT appointment. TC to mobile number - voice mail not set up. TC to home phone contact - no connection/call failed.   Shellie Rogoff P. Leonor Liv PT, MPH 08/20/20 3:24 PM

## 2020-08-25 ENCOUNTER — Other Ambulatory Visit: Payer: Self-pay | Admitting: Family Medicine

## 2020-08-25 DIAGNOSIS — I10 Essential (primary) hypertension: Secondary | ICD-10-CM

## 2020-09-14 ENCOUNTER — Telehealth: Payer: Self-pay

## 2020-09-14 NOTE — Telephone Encounter (Signed)
Patient lvm stating she was having eye irritation and wanted to know if her eye doctor or Dr. Ashley Royalty should be seen.   Attempted to contact patient. No answer. No vm.   Patient should be advised to contact her opthalmologist

## 2020-10-08 ENCOUNTER — Telehealth: Payer: Self-pay

## 2020-10-08 NOTE — Telephone Encounter (Signed)
Savannah Randall would like a continuation order placed for continued outpatient therapy.   Please advise.

## 2020-10-12 ENCOUNTER — Other Ambulatory Visit: Payer: Self-pay | Admitting: Family Medicine

## 2020-10-12 NOTE — Telephone Encounter (Signed)
Would [place] location be more convenient?

## 2020-10-13 NOTE — Telephone Encounter (Signed)
LVM for patient callback with location information for OT referral.

## 2020-10-14 ENCOUNTER — Other Ambulatory Visit: Payer: Self-pay | Admitting: Family Medicine

## 2020-10-14 DIAGNOSIS — M545 Low back pain, unspecified: Secondary | ICD-10-CM

## 2020-10-14 DIAGNOSIS — G8929 Other chronic pain: Secondary | ICD-10-CM

## 2020-10-14 DIAGNOSIS — S161XXA Strain of muscle, fascia and tendon at neck level, initial encounter: Secondary | ICD-10-CM

## 2020-10-14 NOTE — Telephone Encounter (Signed)
Referral entered  

## 2020-11-18 ENCOUNTER — Other Ambulatory Visit: Payer: Self-pay

## 2020-11-18 ENCOUNTER — Ambulatory Visit: Payer: 59 | Admitting: Physical Therapy

## 2020-11-18 ENCOUNTER — Encounter: Payer: Self-pay | Admitting: Physical Therapy

## 2020-11-18 DIAGNOSIS — R29898 Other symptoms and signs involving the musculoskeletal system: Secondary | ICD-10-CM

## 2020-11-18 DIAGNOSIS — M6281 Muscle weakness (generalized): Secondary | ICD-10-CM | POA: Diagnosis not present

## 2020-11-18 DIAGNOSIS — G8929 Other chronic pain: Secondary | ICD-10-CM | POA: Diagnosis not present

## 2020-11-18 DIAGNOSIS — M545 Low back pain, unspecified: Secondary | ICD-10-CM | POA: Diagnosis not present
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
## 2020-11-18 NOTE — Patient Instructions (Signed)
Access Code: [email or web address] URL: [email or web address] Date: 11/18/2020 Prepared by: [employer]gy [employer]  Exercises Child's Pose Stretch - 1 x daily - 7 x weekly - 3 sets - 1 reps - 30 seconds hold Child's Pose with Sidebending - 1 x daily - 7 x weekly - 3 sets - 1 reps - 30 seconds hold Cat Cow - 1 x daily - 7 x weekly - 1 sets - 10 reps Standing Bilateral Low Shoulder Row with Anchored Resistance - 1 x daily - 7 x weekly - 3 sets - 10 reps Shoulder extension with resistance - Neutral - 1 x daily - 7 x weekly - 3 sets - 10 reps Seated Shoulder Horizontal Abduction with Resistance - 1 x daily - 7 x weekly - 3 sets - 10 reps

## 2020-11-25 ENCOUNTER — Other Ambulatory Visit: Payer: Self-pay

## 2020-11-25 ENCOUNTER — Ambulatory Visit: Payer: 59 | Admitting: Physical Therapy

## 2020-11-25 DIAGNOSIS — G8929 Other chronic pain: Secondary | ICD-10-CM | POA: Diagnosis not present

## 2020-11-25 DIAGNOSIS — M545 Low back pain, unspecified: Secondary | ICD-10-CM | POA: Diagnosis not present

## 2020-11-25 DIAGNOSIS — R29898 Other symptoms and signs involving the musculoskeletal system: Secondary | ICD-10-CM | POA: Diagnosis not present

## 2020-11-25 DIAGNOSIS — M6281 Muscle weakness (generalized): Secondary | ICD-10-CM
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
## 2020-12-15 ENCOUNTER — Encounter: Payer: 59 | Admitting: Physical Therapy

## 2020-12-16 ENCOUNTER — Encounter: Payer: 59 | Admitting: Physical Therapy

## 2021-01-06 ENCOUNTER — Encounter: Payer: 59 | Admitting: Family Medicine

## 2021-01-12 ENCOUNTER — Ambulatory Visit (INDEPENDENT_AMBULATORY_CARE_PROVIDER_SITE_OTHER): Payer: 59 | Admitting: Family Medicine

## 2021-01-12 ENCOUNTER — Encounter: Payer: Self-pay | Admitting: Family Medicine

## 2021-01-12 VITALS — BP 128/86 | HR 80 | Temp 97.5°F | Ht 61.0 in | Wt 179.0 lb

## 2021-01-12 DIAGNOSIS — G8929 Other chronic pain: Secondary | ICD-10-CM

## 2021-01-12 DIAGNOSIS — Z13220 Encounter for screening for lipoid disorders: Secondary | ICD-10-CM

## 2021-01-12 DIAGNOSIS — I10 Essential (primary) hypertension: Secondary | ICD-10-CM

## 2021-01-12 DIAGNOSIS — M545 Low back pain, unspecified: Secondary | ICD-10-CM | POA: Diagnosis not present

## 2021-01-12 DIAGNOSIS — Z124 Encounter for screening for malignant neoplasm of cervix: Secondary | ICD-10-CM | POA: Insufficient documentation

## 2021-01-12 DIAGNOSIS — Z1231 Encounter for screening mammogram for malignant neoplasm of breast: Secondary | ICD-10-CM | POA: Diagnosis not present

## 2021-01-12 LAB — CBC WITH DIFFERENTIAL/PLATELET
Absolute Monocytes: 428 cells/uL (ref 200–950)
Basophils Absolute: 32 cells/uL (ref 0–200)
Basophils Relative: 0.5 %
Eosinophils Absolute: 82 cells/uL (ref 15–500)
Eosinophils Relative: 1.3 %
HCT: 38.8 % (ref 35.0–45.0)
Hemoglobin: 12.7 g/dL (ref 11.7–15.5)
Lymphs Abs: 1418 cells/uL (ref 850–3900)
MCH: 28.7 pg (ref 27.0–33.0)
MCHC: 32.7 g/dL (ref 32.0–36.0)
MCV: 87.6 fL (ref 80.0–100.0)
MPV: 11 fL (ref 7.5–12.5)
Monocytes Relative: 6.8 %
Neutro Abs: 4341 cells/uL (ref 1500–7800)
Neutrophils Relative %: 68.9 %
Platelets: 336 10*3/uL (ref 140–400)
RBC: 4.43 10*6/uL (ref 3.80–5.10)
RDW: 13.5 % (ref 11.0–15.0)
Total Lymphocyte: 22.5 %
WBC: 6.3 10*3/uL (ref 3.8–10.8)

## 2021-01-12 LAB — COMPLETE METABOLIC PANEL WITH GFR
AG Ratio: 1.1 (calc) (ref 1.0–2.5)
ALT: 9 U/L (ref 6–29)
AST: 13 U/L (ref 10–35)
Albumin: 4.2 g/dL (ref 3.6–5.1)
Alkaline phosphatase (APISO): 115 U/L (ref 37–153)
BUN: 12 mg/dL (ref 7–25)
CO2: 31 mmol/L (ref 20–32)
Calcium: 9.7 mg/dL (ref 8.6–10.4)
Chloride: 104 mmol/L (ref 98–110)
Creat: 0.72 mg/dL (ref 0.50–1.03)
Globulin: 3.7 g/dL (calc) (ref 1.9–3.7)
Glucose, Bld: 105 mg/dL (ref 65–139)
Potassium: 4 mmol/L (ref 3.5–5.3)
Sodium: 141 mmol/L (ref 135–146)
Total Bilirubin: 0.4 mg/dL (ref 0.2–1.2)
Total Protein: 7.9 g/dL (ref 6.1–8.1)
eGFR: 97 mL/min/{1.73_m2} (ref >=60)

## 2021-01-12 LAB — LIPID PANEL W/REFLEX DIRECT LDL
Cholesterol: 224 mg/dL — ABNORMAL HIGH (ref <200)
HDL: 45 mg/dL — ABNORMAL LOW (ref >=50)
LDL Cholesterol (Calc): 160 mg/dL (calc) — ABNORMAL HIGH
Non-HDL Cholesterol (Calc): 179 mg/dL (calc) — ABNORMAL HIGH (ref <130)
Total CHOL/HDL Ratio: 5 (calc) — ABNORMAL HIGH (ref <5.0)
Triglycerides: 89 mg/dL (ref <150)

## 2021-01-12 LAB — TSH: TSH: 1.5 mIU/L (ref 0.40–4.50)

## 2021-01-12 MED ORDER — NAPROXEN 500 MG PO TABS: 500.0000 mg | ORAL_TABLET | Freq: Two times a day (BID) | ORAL | 1 refills | Status: AC | PRN

## 2021-01-12 MED ORDER — METHOCARBAMOL 500 MG PO TABS: 500.0000 mg | ORAL_TABLET | Freq: Three times a day (TID) | ORAL | 1 refills | Status: DC | PRN

## 2021-01-12 NOTE — Assessment & Plan Note (Signed)
Her blood pressure which remains well controlled with amlodipine at current strength.  I recommend that she continue to work on weight loss and follow a low-sodium diet.

## 2021-01-12 NOTE — Progress Notes (Signed)
Savannah Randall - 55 y.o. female MRN [id number]  Date of birth: 1966-05-16  Subjective Chief Complaint  Patient presents with   Hypertension    HPI Savannah Randall here today for follow-up.  She continues on amlodipine for management of blood pressure.  Blood pressure readings at home have been well controlled.  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  Blood pressure elevated initially upon presentation to clinic today however much improved upon recheck.  She has been working on weight loss and cutting back on her sodium intake.  Weight is down a couple pounds since her last visit.   She denies any chest pain, shortness of breath, palpitations, headache or vision changes.  She does have some intermittent episodes of low back pain with spasm.  She does use Robaxin and naproxen as needed for this.  She does have elevations in her blood pressure when she has been in severe pain.  She has had FMLA paperwork in place for this previously.  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     Social History   Socioeconomic History   Marital status: Married    Spouse name: Not on file   Number of children: Not on file   Years of education: Not on file   Highest education level: Not on file  Occupational History   Not on file  Tobacco Use   Smoking status: Never   Smokeless tobacco: Never  Vaping Use   Vaping Use: Never used  Substance and Sexual Activity   Alcohol use: Not Currently   Drug use: Never   Sexual activity: Not on file  Other Topics  Concern   Not on file  Social History Narrative   Not on file   Social Determinants of Health   Financial Resource Strain: Not on file  Food Insecurity: Not on file  Transportation Needs: Not on file  Physical Activity: Not on file  Stress: Not on file  Social Connections: Not on file    History reviewed. No pertinent family history.  Health Maintenance  Topic Date Due   HIV Screening  Never done   Hepatitis C Screening  Never done   TETANUS/TDAP  Never done   PAP SMEAR-Modifier  Never done   COLONOSCOPY (Pts 45-46yrs Insurance coverage will need to be confirmed)  Never done   Zoster Vaccines- Shingrix (1 of 2) Never done   MAMMOGRAM  03/25/2018   COVID-19 Vaccine (4 - Booster for Moderna series) 09/20/2020   INFLUENZA VACCINE  01/04/2021   Pneumococcal Vaccine 92-40 Years old  Aged Out   HPV VACCINES  Aged Out     ----------------------------------------------------------------------------------------------------------------------------------------------------------------------------------------------------------------- Physical Exam BP 128/86   Pulse 80   Temp (!) 97.5 F (36.4 C)   Ht 5\' 1"  (1.549 m)   Wt 179 lb (81.2 kg)   SpO2 100%   BMI 33.82 kg/m   Physical Exam Constitutional:      Appearance: Normal appearance.  HENT:     Head: Normocephalic and atraumatic.  Eyes:     General: No scleral icterus. Cardiovascular:     Rate and Rhythm: Normal rate and regular rhythm.  Pulmonary:     Effort: Pulmonary effort is normal.     Breath sounds: Normal breath sounds.  Musculoskeletal:     Cervical back: Neck supple.  Neurological:     General: No focal deficit present.     Mental Status: She is alert.  Psychiatric:        Mood and Affect: Mood normal.        Behavior: Behavior normal.     ------------------------------------------------------------------------------------------------------------------------------------------------------------------------------------------------------------------- Assessment and Plan  Essential hypertension Her blood pressure which remains well controlled with amlodipine at current strength.  I recommend that she continue to work on weight loss and follow a low-sodium diet.  Chronic bilateral low back pain without sciatica Managing with naproxen and Robaxin as needed.  Refills provided.  Encounter for screening mammogram for malignant neoplasm of breast Screening mammogram ordered.  Cervical cancer screening Referral to GYN for updated cervical cancer screening.   Meds ordered this encounter  Medications   naproxen (NAPROSYN) 500 MG tablet    Sig: Take 1 tablet (500 mg total) by mouth 2 (two) times daily as needed for mild pain. Take with food    Dispense:  30 tablet    Refill:  1   methocarbamol (ROBAXIN) 500 MG tablet    Sig: Take 1 tablet (500 mg total) by mouth every 8 (eight) hours as needed for muscle spasms.    Dispense:  30 tablet    Refill:  1    Return in about 6 months (around 07/15/2021) for HTN.    This visit occurred during the SARS-CoV-2 public health emergency.  Safety protocols were in place, including screening questions prior to the visit, additional usage of staff PPE, and extensive cleaning of exam room while observing appropriate contact time as indicated for disinfecting solutions.

## 2021-01-12 NOTE — Assessment & Plan Note (Signed)
Referral to GYN for updated cervical cancer screening.

## 2021-01-12 NOTE — Patient Instructions (Signed)
Great to see you today! We'll be in touch with labs.  Follow up with me in about 6 months.

## 2021-01-12 NOTE — Assessment & Plan Note (Signed)
Managing with naproxen and Robaxin as needed.  Refills provided.

## 2021-01-12 NOTE — Assessment & Plan Note (Signed)
Screening mammogram ordered

## 2021-01-18 ENCOUNTER — Telehealth: Payer: Self-pay | Admitting: *Deleted

## 2021-01-18 NOTE — Telephone Encounter (Signed)
Returned call from 01/15/2021 at 3:39 PM, office closed. Left patient a message with appointment date, time, and arrival time. Automated system had called patient with appointment reminder, patient thought it was the office calling.

## 2021-01-20 ENCOUNTER — Other Ambulatory Visit (HOSPITAL_COMMUNITY)
Admission: RE | Admit: 2021-01-20 | Discharge: 2021-01-20 | Disposition: A | Discharge status: Home / Self Care | Payer: 59 | Source: Ambulatory Visit | Attending: Obstetrics & Gynecology | Admitting: Obstetrics & Gynecology

## 2021-01-20 ENCOUNTER — Encounter: Payer: Self-pay | Admitting: Obstetrics & Gynecology

## 2021-01-20 ENCOUNTER — Other Ambulatory Visit: Payer: Self-pay

## 2021-01-20 ENCOUNTER — Telehealth: Payer: Self-pay | Admitting: Family Medicine

## 2021-01-20 ENCOUNTER — Ambulatory Visit (INDEPENDENT_AMBULATORY_CARE_PROVIDER_SITE_OTHER): Payer: 59

## 2021-01-20 ENCOUNTER — Ambulatory Visit (INDEPENDENT_AMBULATORY_CARE_PROVIDER_SITE_OTHER): Payer: 59 | Admitting: Obstetrics & Gynecology

## 2021-01-20 VITALS — BP 150/103 | HR 74 | Ht 61.0 in | Wt 179.0 lb

## 2021-01-20 DIAGNOSIS — Z780 Asymptomatic menopausal state: Secondary | ICD-10-CM

## 2021-01-20 DIAGNOSIS — N852 Hypertrophy of uterus: Secondary | ICD-10-CM

## 2021-01-20 DIAGNOSIS — Z01411 Encounter for gynecological examination (general) (routine) with abnormal findings: Secondary | ICD-10-CM

## 2021-01-20 DIAGNOSIS — Z1231 Encounter for screening mammogram for malignant neoplasm of breast: Secondary | ICD-10-CM | POA: Diagnosis not present

## 2021-01-20 NOTE — Progress Notes (Signed)
Pt did not take BP meds today  

## 2021-01-20 NOTE — Progress Notes (Signed)
Subjective:     Savannah Randall is a 55 y.o. female here for a routine exam.  Current complaints: had spotting after intercourse 3 weeks ago.  This is a one time event and has not occurred again.  Pt denies pain.     Gynecologic History No LMP recorded. Patient is perimenopausal. Contraception: post menopausal status Last Pap: 2017. Results were normal per patient Last mammogram: 2017. Results were: normal per patient  Obstetric History OB History  Gravida Para Term Preterm AB Living  1       1    SAB IAB Ectopic Multiple Live Births  1            # Outcome Date GA Lbr Len/2nd Weight Sex Delivery Anes PTL Lv  1 SAB              The following portions of the patient's history were reviewed and updated as appropriate: allergies, current medications, past family history, past medical history, past social history, past surgical history, and problem list.  Review of Systems Pertinent items noted in HPI and remainder of comprehensive ROS otherwise negative.    Objective:     Vitals:   01/20/21 1025  BP: (!) 150/103  Pulse: 74  Weight: 179 lb (81.2 kg)  Height: 5\' 1"  (1.549 m)   Vitals:  WNL General appearance: alert, cooperative and no distress  HEENT: Normocephalic, without obvious abnormality, atraumatic Eyes: negative Throat: lips, mucosa, and tongue normal; teeth and gums normal  Respiratory: Clear to auscultation bilaterally  CV: Regular rate and rhythm  Breasts:  Normal appearance, no masses or tenderness, no nipple retraction or dimpling  GI: Soft, non-tender; bowel sounds normal; no masses,  no organomegaly  GU: External Genitalia:  Tanner V, no lesion Urethra:  No prolapse   Vagina: Pink, normal rugae, no blood or discharge  Cervix: No CMT, no lesion  Uterus:  Enlarged uters and contour, non tender  Adnexa: Normal, no masses, non tender  Musculoskeletal: No edema, redness or tenderness in the calves or thighs  Skin: No lesions or rash  Lymphatic: Axillary  adenopathy: none     Psychiatric: Normal mood and behavior        Assessment:    Healthy female exam.    Plan:   Pap smear with cotesting today. Patient has mammogram appointment today Patient has hypertension and has not taken her blood pressure medication today.  Reinforced the importance of good blood pressure management. Reviewed adequate calcium and vitamin D intake.  Patient believes that she has 1200 mg of calcium intake per day.  Patient given a handout about calcium containing foods. Pelvic complete for enlarged uterus and post coital bleeding.

## 2021-01-20 NOTE — Telephone Encounter (Signed)
Pt dropped off FMLA to be filled out, she was informed Dr. Ashley Royalty is out this week and that there is a possible fee for the paperwork. It was left in [name] basket

## 2021-01-21 LAB — CYTOLOGY - PAP
Comment: NEGATIVE
Diagnosis: NEGATIVE
High risk HPV: NEGATIVE

## 2021-02-10 ENCOUNTER — Telehealth: Payer: Self-pay | Admitting: Family Medicine

## 2021-02-10 NOTE — Telephone Encounter (Signed)
Pt dropped off FMLA paperwork requested by Dr. Ashley Royalty, paperwork was left in Dr. Ashley Royalty basket along with a billing form attached

## 2021-02-16 NOTE — Telephone Encounter (Signed)
Advised patient the forms were received. Dr. Ashley Royalty was out of the office and will complete.

## 2021-02-19 NOTE — Telephone Encounter (Signed)
Completed form faxed to [employer].   Physician billing form and docs (including envelope) given to [employer].   Please contact Ms. Lame for pick-up and payment. Thanks

## 2021-02-19 NOTE — Telephone Encounter (Signed)
Called and left voicemail for patient notifying that paperwork is up front and ready to be picked up and that there is a $29 fee. Placed paperwork with in folder that we keep others that need to come pick up & make payment. AM

## 2021-02-19 NOTE — Telephone Encounter (Signed)
Form completed and placed in assistant's box.  CM

## 2021-07-15 ENCOUNTER — Encounter: Payer: Self-pay | Admitting: Family Medicine

## 2021-07-15 ENCOUNTER — Ambulatory Visit: Payer: Self-pay | Admitting: Family Medicine

## 2021-08-05 ENCOUNTER — Encounter: Payer: Self-pay | Admitting: Family Medicine

## 2021-08-05 ENCOUNTER — Telehealth: Payer: Self-pay

## 2021-08-05 ENCOUNTER — Other Ambulatory Visit: Payer: Self-pay

## 2021-08-05 ENCOUNTER — Ambulatory Visit: Payer: Commercial Managed Care - PPO | Admitting: Family Medicine

## 2021-08-05 VITALS — BP 143/94 | HR 57 | Temp 97.6°F | Ht 61.0 in | Wt 179.0 lb

## 2021-08-05 DIAGNOSIS — I10 Essential (primary) hypertension: Secondary | ICD-10-CM

## 2021-08-05 DIAGNOSIS — Z23 Encounter for immunization: Secondary | ICD-10-CM | POA: Diagnosis not present

## 2021-08-05 MED ORDER — AMLODIPINE BESYLATE 10 MG PO TABS: 10.0000 mg | ORAL_TABLET | Freq: Every day | ORAL | 3 refills | Status: DC

## 2021-08-05 MED ORDER — CHLORTHALIDONE 25 MG PO TABS: 12.5000 mg | ORAL_TABLET | Freq: Every day | ORAL | 0 refills | Status: DC

## 2021-08-05 NOTE — Assessment & Plan Note (Signed)
Blood pressure mains elevated.  Recommending 12.5 mg chlorthalidone daily.  Recheck labs in 2 weeks.  Return in 4 weeks for blood pressure check. ?

## 2021-08-05 NOTE — Patient Instructions (Addendum)
Continue amlodipine daily.  ?Let's add chlorthalidone 1/2 tab daily as well.  ?Have labs completed in 2 weeks.  ?

## 2021-08-05 NOTE — Progress Notes (Signed)
?Savannah Randall - 56 y.o. female MRN [id number]  Date of birth: 1966/05/07 ? ?Subjective ?Chief Complaint  ?Patient presents with  ? Hypertension  ? ? ?HPI ?Savannah Randall is a 56 year old female here today for follow-up of blood pressure.  She reports she is taking amlodipine 10 mg daily as directed.  Blood pressure is elevated today.  She does have intermittent headaches related to blood pressure.  She denies other symptoms related to blood pressure at this time include all chest pain, shortness of breath, palpitations or vision changes.  She does admit to some increased stress recently due to family members illness.   ? ?ROS:  A comprehensive ROS was completed and negative except as noted per HPI ? ?Allergies  ?Allergen Reactions  ? Benadryl Allergy [Diphenhydramine Hcl] Anaphylaxis and Hives  ? Mushroom Extract Complex Anaphylaxis and Nausea Only  ?  Can't be near them at all  ? Penicillins Hives  ? ? ?Past Medical History:  ?Diagnosis Date  ? Hypertension   ? ? ?Past Surgical History:  ?Procedure Laterality Date  ? OTHER SURGICAL HISTORY  2001  ? 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 ? ?Social History  ? ?Socioeconomic History  ? Marital status: Married  ?  Spouse name: Not on file  ? Number of children: Not on file  ? Years of education: Not on file  ? Highest education level: Not on file  ?Occupational History  ? Not on file  ?Tobacco Use  ? Smoking status: Never  ? Smokeless tobacco: Never  ?Vaping Use  ? Vaping Use: Never used  ?Substance and Sexual Activity  ? Alcohol use: Not Currently  ? Drug use: Never  ? Sexual activity: Yes  ?  Birth control/protection: None  ?Other Topics Concern  ? Not on file  ?Social History Narrative  ? Not on file  ? ?Social Determinants of Health  ? ?Financial Resource Strain: Not on file  ?Food Insecurity: Not on file  ?Transportation Needs: Not on file  ?Physical Activity: Not on file  ?Stress: Not on file  ?Social Connections: Not on file  ? ? ?Family History  ?Problem  Relation Age of Onset  ? Diabetes Mother   ? Heart disease Mother   ? Heart disease Father   ? ? ?Health Maintenance  ?Topic Date Due  ? HIV Screening  Never done  ? Hepatitis C Screening  Never done  ? COLONOSCOPY (Pts 45-80yrs Insurance coverage will need to be confirmed)  Never done  ? INFLUENZA VACCINE  09/03/2021 (Originally 01/04/2021)  ? Zoster Vaccines- Shingrix (1 of 2) 11/05/2021 (Originally 07/28/2013)  ? MAMMOGRAM  01/21/2023  ? PAP SMEAR-Modifier  01/21/2024  ? TETANUS/TDAP  08/06/2031  ? HPV VACCINES  Aged Out  ? COVID-19 Vaccine  Discontinued  ? ? ? ?----------------------------------------------------------------------------------------------------------------------------------------------------------------------------------------------------------------- ?Physical Exam ?BP (!) 143/94 (BP Location: Left Arm, Patient Position: Sitting, Cuff Size: Large)   Pulse (!) 57   Temp 97.6 ?F (36.4 ?C)   Ht 5\' 1"  (1.549 m)   Wt 179 lb (81.2 kg)   SpO2 98%   BMI 33.82 kg/m?  ? ?Physical Exam ?Constitutional:   ?   Appearance: Normal appearance.  ?Eyes:  ?   General: No scleral icterus. ?Cardiovascular:  ?   Rate and Rhythm: Normal rate and regular rhythm.  ?Pulmonary:  ?   Effort: Pulmonary effort is normal.  ?   Breath sounds: Normal breath sounds.  ?Musculoskeletal:  ?   Cervical back: Neck supple.  ?  Neurological:  ?   Mental Status: She is alert.  ?Psychiatric:     ?   Mood and Affect: Mood normal.     ?   Behavior: Behavior normal.  ? ? ?------------------------------------------------------------------------------------------------------------------------------------------------------------------------------------------------------------------- ?Assessment and Plan ? ?Essential hypertension ?Blood pressure mains elevated.  Recommending 12.5 mg chlorthalidone daily.  Recheck labs in 2 weeks.  Return in 4 weeks for blood pressure check. ? ? ?Meds ordered this encounter  ?Medications  ? chlorthalidone  (HYGROTON) 25 MG tablet  ?  Sig: Take 0.5 tablets (12.5 mg total) by mouth daily.  ?  Dispense:  90 tablet  ?  Refill:  0  ? amLODipine (NORVASC) 10 MG tablet  ?  Sig: Take 1 tablet (10 mg total) by mouth daily.  ?  Dispense:  90 tablet  ?  Refill:  3  ? ? ?Return in about 6 months (around 02/05/2022) for HTN. ? ? ? ?This visit occurred during the SARS-CoV-2 public health emergency.  Safety protocols were in place, including screening questions prior to the visit, additional usage of staff PPE, and extensive cleaning of exam room while observing appropriate contact time as indicated for disinfecting solutions.  ? ?

## 2021-08-05 NOTE — Telephone Encounter (Signed)
Patient left VM with questions regarding chlorthalidone. Advised to get a pill cutter from any local pharmacy or could take the whole tablet.  ?

## 2021-08-18 ENCOUNTER — Telehealth: Payer: Self-pay

## 2021-08-18 NOTE — Telephone Encounter (Signed)
LVM advising pt of lab hours. No appt required. ?

## 2021-08-20 LAB — BASIC METABOLIC PANEL
BUN: 19 mg/dL (ref 7–25)
CO2: 30 mmol/L (ref 20–32)
Calcium: 9.6 mg/dL (ref 8.6–10.4)
Chloride: 98 mmol/L (ref 98–110)
Creat: 0.94 mg/dL (ref 0.50–1.03)
Glucose, Bld: 133 mg/dL — ABNORMAL HIGH (ref 65–99)
Potassium: 3.7 mmol/L (ref 3.5–5.3)
Sodium: 138 mmol/L (ref 135–146)

## 2021-09-02 ENCOUNTER — Ambulatory Visit (INDEPENDENT_AMBULATORY_CARE_PROVIDER_SITE_OTHER): Payer: Commercial Managed Care - PPO | Admitting: Family Medicine

## 2021-09-02 VITALS — BP 118/83 | HR 90

## 2021-09-02 DIAGNOSIS — I10 Essential (primary) hypertension: Secondary | ICD-10-CM | POA: Diagnosis not present

## 2021-09-02 NOTE — Progress Notes (Signed)
Patient comes in today for blood pressure check.  ? ?Savannah Randall is taking Amlodipine 10 mg daily and Chlorthalidone 25 mg daily for blood pressure control. She was told to take Chlorthalidone 1/2 tablet daily but she could not split pills in half and decided to take a full tablet daily.   ? ?She denies any side effects, headaches, chest pain, palpitations, dizziness, or shortness of breath.  ? ?Savannah Randall has been checking blood pressure readings at home. ?09/02/2021: 132/87 ?08/30/2021: 138/97 ?08/28/2021: 107/75 ?08/26/2021 (forgot meds/at work) 182/119, 185/121 ?08/26/2021 (after meds/at home) 123/62 ? ?Her first blood pressure reading today is: 118/83. ? ?I let patient go home and let her know we would call her if any changes were needed. Advised until then to stay on current dosage of medication and keep 6 month follow up with Dr. Zigmund Daniel.  ? ?

## 2021-09-02 NOTE — Progress Notes (Signed)
Medical screening examination/treatment was performed by qualified clinical staff member and as supervising physician I was immediately available for consultation/collaboration. I have reviewed documentation and agree with assessment and plan.  Kaylan Friedmann, DO  

## 2021-09-28 ENCOUNTER — Ambulatory Visit: Payer: Commercial Managed Care - PPO | Admitting: Physician Assistant

## 2021-09-28 ENCOUNTER — Ambulatory Visit (INDEPENDENT_AMBULATORY_CARE_PROVIDER_SITE_OTHER): Payer: Commercial Managed Care - PPO

## 2021-09-28 ENCOUNTER — Encounter: Payer: Self-pay | Admitting: Physician Assistant

## 2021-09-28 VITALS — BP 115/79 | HR 98 | Ht 61.0 in | Wt 174.0 lb

## 2021-09-28 DIAGNOSIS — M79662 Pain in left lower leg: Secondary | ICD-10-CM | POA: Insufficient documentation

## 2021-09-28 DIAGNOSIS — E782 Mixed hyperlipidemia: Secondary | ICD-10-CM | POA: Insufficient documentation

## 2021-09-28 DIAGNOSIS — I8002 Phlebitis and thrombophlebitis of superficial vessels of left lower extremity: Secondary | ICD-10-CM | POA: Insufficient documentation
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
## 2021-09-28 NOTE — Progress Notes (Signed)
GREAT news. No DVT but also no evidence of a clot in the superficial vein either. At times inflammation alone can be a little harder to see. I want you to start  ibuprofen 600mg  three times a day for the next 5 days and use warm compresses 2-3 times a day and elevate your feet as much as you can, consider wearing some compression stockings when you are on your feet. If not any better in 4-5 days let know.

## 2021-09-28 NOTE — Progress Notes (Signed)
2 weeks ?Left calf  ?Advil, soaks ?Hurts when walking on it ?No injury ?No masses that she feels ?

## 2021-09-28 NOTE — Patient Instructions (Signed)
Thrombophlebitis ? ?Thrombophlebitis is a condition in which a blood clot and inflammation occur inside a vein. This can happen in the arms, legs, or torso. Thrombophlebitis may involve superficial veins, deep veins, or both. Superficial veins are close to the surface of the body and are part of the superficial venous system. Veins that are deeper inside the body are part of the deep venous system. ?When this condition happens in a superficial vein (superficial thrombophlebitis), it is usually not serious.However, when the condition happens in a vein that is part of the deep venous system (deep vein thrombosis, DVT), it can cause serious problems. ?What are the causes? ?This condition may be caused by: ?Infection, injury, or trauma to a vein. ?Inflammation of the veins. ?Medical conditions that can cause blood to clot more easily (hypercoagulable state). ?Backing up, or reflux, of blood flow through the veins (chronic venous insufficiency or venous stasis). ?What increases the risk? ?The following factors may make you more likely to develop this condition: ?Having a long, thin tube (catheter) put in a vein, such as a central line, port, or IV catheter. ?Getting certain medicines through a catheter that can irritate the vein. ?Pregnancy or having recently given birth. ?Cancer. ?Obesity. ?Taking oral contraceptive pills (OCPs) or hormone therapy (HT) medicines. ?Spasms of veins. ?Immobilization, or not moving the limbs for prolonged periods. ?What are the signs or symptoms? ?The main symptoms of this condition are: ?Swelling and pain in an arm or leg. If the affected vein is in the leg, you may feel pain while standing or walking. ?Warmth or redness in an arm or leg. ?Tenderness in the affected area when it is touched. ?Other symptoms include: ?Low-grade fever. ?Muscle aches. ?A bulging or hard vein (venous distension). ?In some cases, there are no symptoms. ?How is this diagnosed? ?This condition may be diagnosed  based on: ?Your symptoms and medical history. ?A physical exam. ?Tests, such as a test that uses sound waves to make images (duplex ultrasound). ?How is this treated? ?Treatment depends on how severe the condition is and which area of the body is affected. Treatment may include: ?Applying a warm compress or heating pad to affected areas. This may need to be repeated several times a day. ?Moving the affected limb. For example, you may need to start doing walking exercises right away. You will also be encouraged to continue your usual daily activities. ?Raising (elevating) the affected arm or leg above the level of your heart. ?Medicines, such as: ?Anti-inflammatory medicines, such as ibuprofen. ?Blood thinners (anticoagulants) or anti-platelet drugs such as aspirin. ?Removing an IV or central line that may be causing the problem. ?Wearing compression stockings to help prevent blood clots and reduce swelling in your legs. ?Follow these instructions at home: ?Medicines ?Take over-the-counter and prescription medicines only as told by your health care provider. ?If you are taking blood thinners: ?Talk with your health care provider before you take any medicines that contain aspirin or NSAIDs, such as ibuprofen. These medicines increase your risk for dangerous bleeding. ?Take your medicine exactly as told, at the same time every day. ?Avoid activities that could cause injury or bruising, and follow instructions about how to prevent falls. ?Wear a medical alert bracelet or carry a card that lists what medicines you take. ?Managing pain, stiffness, and swelling ? ?If directed, apply heat to the affected area as often as told by your health care provider. Use the heat source that your health care provider recommends, such as a moist heat pack   or a heating pad. ?Place a towel between your skin and the heat source. ?Leave the heat on for 20-30 minutes. ?Remove the heat if your skin turns bright red. This is especially  important if you are unable to feel pain, heat, or cold. You have a greater risk of getting burned. ?Elevate the affected area above the level of your heart while you are sitting or lying down. ?Activity ?Return to your normal activities as told by your health care provider. Ask your health care provider what activities are safe for you. ?Avoid sitting for a long time without moving. Get up to take short walks every 1-2 hours. This is important to improve blood flow and breathing. Ask for help if you feel weak or unsteady. ?Do exercises as told by your health care provider. ?Rest as told by your health care provider. ?General instructions ?Drink enough fluid to keep your urine pale yellow. ?Wear compression stockings as told by your health care provider. ?Do not use any products that contain nicotine or tobacco. These products include cigarettes, chewing tobacco, and vaping devices, such as e-cigarettes. If you need help quitting, ask your health care provider. ?Keep all follow-up visits. This is important. ?Contact a health care provider if: ?You miss a dose of your blood thinner, if applicable. ?Your symptoms do not improve. ?You have unusual bruising. ?You have nausea, vomiting, or diarrhea that lasts for more than a day. ?Get help right away if: ?You are breathing fast or have chest pain. ?You have blood in your vomit, urine, or stool. ?You have severe pain in your affected arm or leg or new pain in any arm or leg. ?You have light-headedness, dizziness, a severe headache, or confusion. ?These symptoms may represent a serious problem that is an emergency. Do not wait to see if the symptoms will go away. Get medical help right away. Call your local emergency services (911 in the [place] Do not drive yourself to the hospital. ?Summary ?Thrombophlebitis is a condition in which a blood clot forms in a vein, causing inflammation and often pain. This can happen in both superficial and deep veins. ?The main symptom of  this condition is swelling and pain around the affected vein. Tenderness and redness may also be present. ?Treatment may include warm compresses, compression stockings, anti-inflammatory medicines, or blood thinners. ?Make sure you take all medicines, especially blood thinners, as instructed and keep all follow-up visits to ensure proper healing of the vein. ?This information is not intended to replace advice given to you by your health care provider. Make sure you discuss any questions you have with your health care provider. ?Document Revised: 11/16/2020 Document Reviewed: 11/16/2020 ?Elsevier Patient Education ? 2023 [employer] ? ?

## 2021-10-01 ENCOUNTER — Encounter: Payer: Self-pay | Admitting: Neurology

## 2021-10-14 ENCOUNTER — Other Ambulatory Visit: Payer: Self-pay

## 2021-10-14 MED ORDER — CHLORTHALIDONE 25 MG PO TABS: 12.5000 mg | ORAL_TABLET | Freq: Every day | ORAL | 0 refills | Status: DC

## 2021-12-10 ENCOUNTER — Telehealth: Payer: Self-pay

## 2021-12-10 NOTE — Telephone Encounter (Signed)
Received vm from pt requesting clarification about $15 copay. Please advise.

## 2021-12-10 NOTE — Telephone Encounter (Signed)
Correct, her insurance card states $15 co-pay for PCP visit.

## 2021-12-31 ENCOUNTER — Encounter (HOSPITAL_COMMUNITY): Payer: Self-pay | Admitting: Emergency Medicine

## 2021-12-31 ENCOUNTER — Emergency Department (HOSPITAL_COMMUNITY): Payer: Commercial Managed Care - PPO

## 2021-12-31 ENCOUNTER — Other Ambulatory Visit: Payer: Self-pay

## 2021-12-31 ENCOUNTER — Telehealth: Payer: Self-pay

## 2021-12-31 ENCOUNTER — Emergency Department (HOSPITAL_COMMUNITY)
Admission: EM | Admit: 2021-12-31 | Discharge: 2021-12-31 | Disposition: A | Discharge status: Home / Self Care | Payer: Commercial Managed Care - PPO | Attending: Emergency Medicine | Admitting: Emergency Medicine

## 2021-12-31 DIAGNOSIS — R9431 Abnormal electrocardiogram [ECG] [EKG]: Secondary | ICD-10-CM | POA: Diagnosis not present

## 2021-12-31 DIAGNOSIS — Z79899 Other long term (current) drug therapy: Secondary | ICD-10-CM | POA: Insufficient documentation

## 2021-12-31 DIAGNOSIS — R55 Syncope and collapse: Secondary | ICD-10-CM | POA: Diagnosis present

## 2021-12-31 LAB — URINALYSIS, ROUTINE W REFLEX MICROSCOPIC
Bilirubin Urine: NEGATIVE
Glucose, UA: NEGATIVE mg/dL
Hgb urine dipstick: NEGATIVE
Ketones, ur: NEGATIVE mg/dL
Leukocytes,Ua: NEGATIVE
Nitrite: NEGATIVE
Protein, ur: NEGATIVE mg/dL
Specific Gravity, Urine: 1.002 — ABNORMAL LOW (ref 1.005–1.030)
pH: 5 (ref 5.0–8.0)

## 2021-12-31 LAB — CBC
HCT: 37.5 % (ref 36.0–46.0)
Hemoglobin: 12.6 g/dL (ref 12.0–15.0)
MCH: 29.7 pg (ref 26.0–34.0)
MCHC: 33.6 g/dL (ref 30.0–36.0)
MCV: 88.4 fL (ref 80.0–100.0)
Platelets: 342 10*3/uL (ref 150–400)
RBC: 4.24 MIL/uL (ref 3.87–5.11)
RDW: 13.8 % (ref 11.5–15.5)
WBC: 7.6 10*3/uL (ref 4.0–10.5)
nRBC: 0 % (ref 0.0–0.2)

## 2021-12-31 LAB — HEPATIC FUNCTION PANEL
ALT: 11 U/L (ref 0–44)
AST: 15 U/L (ref 15–41)
Albumin: 3.7 g/dL (ref 3.5–5.0)
Alkaline Phosphatase: 86 U/L (ref 38–126)
Bilirubin, Direct: <0.1 mg/dL (ref 0.0–0.2)
Total Bilirubin: 0.4 mg/dL (ref 0.3–1.2)
Total Protein: 8.1 g/dL (ref 6.5–8.1)

## 2021-12-31 LAB — CBG MONITORING, ED: Glucose-Capillary: 106 mg/dL — ABNORMAL HIGH (ref 70–99)

## 2021-12-31 LAB — BASIC METABOLIC PANEL
Anion gap: 9 (ref 5–15)
BUN: 17 mg/dL (ref 6–20)
CO2: 23 mmol/L (ref 22–32)
Calcium: 9 mg/dL (ref 8.9–10.3)
Chloride: 101 mmol/L (ref 98–111)
Creatinine, Ser: 0.88 mg/dL (ref 0.44–1.00)
GFR, Estimated: >60 mL/min (ref >60)
Glucose, Bld: 115 mg/dL — ABNORMAL HIGH (ref 70–99)
Potassium: 3.1 mmol/L — ABNORMAL LOW (ref 3.5–5.1)
Sodium: 133 mmol/L — ABNORMAL LOW (ref 135–145)

## 2021-12-31 LAB — I-STAT BETA HCG BLOOD, ED (MC, WL, AP ONLY): I-stat hCG, quantitative: <5 m[IU]/mL (ref <5)

## 2021-12-31 LAB — TROPONIN I (HIGH SENSITIVITY)
Troponin I (High Sensitivity): 3 ng/L (ref <18)
Troponin I (High Sensitivity): 4 ng/L (ref <18)

## 2021-12-31 LAB — D-DIMER, QUANTITATIVE: D-Dimer, Quant: 0.59 ug/mL-FEU — ABNORMAL HIGH (ref 0.00–0.50)

## 2021-12-31 MED ORDER — IOHEXOL 350 MG/ML SOLN
75.0000 mL | Freq: Once | INTRAVENOUS | Status: AC | PRN
  Administered 2021-12-31: 75 mL via INTRAVENOUS

## 2021-12-31 MED ORDER — SODIUM CHLORIDE (PF) 0.9 % IJ SOLN
INTRAMUSCULAR | Status: AC
  Administered 2021-12-31: 10 mL
  Filled 2021-12-31: qty 50

## 2021-12-31 MED ORDER — POTASSIUM CHLORIDE CRYS ER 20 MEQ PO TBCR
40.0000 meq | EXTENDED_RELEASE_TABLET | Freq: Once | ORAL | Status: AC
  Administered 2021-12-31: 40 meq via ORAL
  Filled 2021-12-31: qty 2

## 2021-12-31 NOTE — ED Provider Triage Note (Signed)
Emergency Medicine Provider Triage Evaluation Note  Savannah Randall , a 56 y.o. female  was evaluated in triage.  Pt complains of collapse.  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  Felt like her right leg locked on her, she felt lightheaded and fell forward.  Did not hit her head, patient denies losing consciousness.  Denies any prodromal chest pain or shortness of breath.  She has never had anything like this happen before, she denies any lateralized weakness or numbness.  She vomited after falling.  Sent from urgent care to ED for further evaluation..  Review of Systems  Per HPI  Physical Exam  BP (!) 145/106 (BP Location: Right Arm)   Pulse 98   Temp 98.3 F (36.8 C) (Oral)   Resp 18   Ht 5\' 1"  (1.549 m)   Wt 73 kg   SpO2 91%   BMI 30.42 kg/m  Gen:   Awake, no distress   Resp:  Normal effort  MSK:   Moves extremities without difficulty  Other:  Cranial nerves II through XII are grossly intact, grips equal bilaterally.  Lower extremity strength equal bilaterally.  Medical Decision Making  Medically screening exam initiated at 11:53 AM.  Appropriate orders placed.  Kristen Fromm was informed that the remainder of the evaluation will be completed by another provider, this initial triage assessment does not replace that evaluation, and the importance of remaining in the ED until their evaluation is complete.     Luther Redo, PA-C 12/31/21 1154

## 2021-12-31 NOTE — ED Triage Notes (Signed)
BIBA Per EMS: Pt coming from UC w/ c/o syncope, dizziness, N/V  250 mL fluids given en route  138/40 BP after fluids  CBG 114 12 lead unremarkable

## 2021-12-31 NOTE — ED Provider Notes (Signed)
[facility]-EMERGENCY DEPT Provider Note   CSN: [id number] Arrival date & time: 12/31/21  1136     History  Chief Complaint  Patient presents with   Loss of Consciousness    Tarhonda Lennox is a 56 y.o. female.  Patient brought in by EMS with history of near syncope.  She has a history of high blood pressure.  She was waiting at the department of transportation for her physical and states she was sitting in the waiting room for over an hour.  When her name was called she got up, took a few steps the next thing she knows she was on the ground.  Denies hitting her head or losing consciousness.  Denies that she lost consciousness.  Does not know how she ended up on the ground.  Did not feel dizzy or lightheaded.  Did not have any room spinning.  No chest pain or shortness of breath.  No history of unilateral weakness, numbness or tingling.  No difficulty speaking or difficulty swallowing.  Did have 1 episode of vomiting while in transport to the ED.  States she did not eat or drink today but did take her blood pressure medications.  She is not diabetic. She feels fine right now denies any head, neck, back, chest or abdominal pain.  No recent changes in medications   Loss of Consciousness      Home Medications Prior to Admission medications   Medication Sig Start Date End Date Taking? Authorizing Provider  amLODipine (NORVASC) 10 MG tablet Take 1 tablet (10 mg total) by mouth daily. 08/05/21   Everrett Coombe, DO  chlorthalidone (HYGROTON) 25 MG tablet Take 0.5 tablets (12.5 mg total) by mouth daily. 10/14/21   Everrett Coombe, DO  clindamycin (CLEOCIN) 300 MG capsule Take 300 mg by mouth 3 (three) times daily. 09/23/21   [provider]  ferrous sulfate 325 (65 FE) MG tablet Take 1 tablet (325 mg total) by mouth daily. 08/09/18   Fayrene Helper, PA-C  fexofenadine-pseudoephedrine (ALLEGRA-D ALLERGY & CONGESTION) 180-240 MG 24 hr tablet Take 1 tablet by mouth daily.  09/16/19   Everrett Coombe, DO  ibuprofen (ADVIL,MOTRIN) 200 MG tablet Take 400 mg by mouth every 6 (six) hours as needed for cramping.    [provider]  methocarbamol (ROBAXIN) 500 MG tablet Take 1 tablet (500 mg total) by mouth every 8 (eight) hours as needed for muscle spasms. 01/12/21   Everrett Coombe, DO  naproxen (NAPROSYN) 500 MG tablet Take 1 tablet (500 mg total) by mouth 2 (two) times daily as needed for mild pain. Take with food 01/12/21   Everrett Coombe, DO      Allergies    Benadryl allergy [diphenhydramine hcl], Mushroom extract complex, and Penicillins    Review of Systems   Review of Systems  Cardiovascular:  Positive for syncope.    Physical Exam Updated Vital Signs BP (!) 134/93   Pulse 80   Temp 98.3 F (36.8 C) (Oral)   Resp 18   Ht 5\' 1"  (1.549 m)   Wt 73 kg   SpO2 100%   BMI 30.42 kg/m  Physical Exam Vitals and nursing note reviewed.  Constitutional:      General: She is not in acute distress.    Appearance: She is well-developed.  HENT:     Head: Normocephalic and atraumatic.     Mouth/Throat:     Pharynx: No oropharyngeal exudate.  Eyes:     Conjunctiva/sclera: Conjunctivae normal.  Pupils: Pupils are equal, round, and reactive to light.  Neck:     Comments: No C spine tenderness Cardiovascular:     Rate and Rhythm: Normal rate and regular rhythm.     Heart sounds: Normal heart sounds. No murmur heard. Pulmonary:     Effort: Pulmonary effort is normal. No respiratory distress.     Breath sounds: Normal breath sounds.  Abdominal:     Palpations: Abdomen is soft.     Tenderness: There is no abdominal tenderness. There is no guarding or rebound.  Musculoskeletal:        General: No tenderness. Normal range of motion.     Cervical back: Normal range of motion and neck supple.  Skin:    General: Skin is warm.  Neurological:     Mental Status: She is alert and oriented to person, place, and time.     Cranial Nerves: No cranial nerve  deficit.     Motor: No abnormal muscle tone.     Coordination: Coordination normal.     Comments:  5/5 strength throughout. CN 2-12 intact.Equal grip strength.   Psychiatric:        Behavior: Behavior normal.     ED Results / Procedures / Treatments   Labs (all labs ordered are listed, but only abnormal results are displayed) Labs Reviewed  BASIC METABOLIC PANEL - Abnormal; Notable for the following components:      Result Value   Sodium 133 (*)    Potassium 3.1 (*)    Glucose, Bld 115 (*)    All other components within normal limits  URINALYSIS, ROUTINE W REFLEX MICROSCOPIC - Abnormal; Notable for the following components:   Color, Urine COLORLESS (*)    Specific Gravity, Urine 1.002 (*)    All other components within normal limits  D-DIMER, QUANTITATIVE - Abnormal; Notable for the following components:   D-Dimer, Quant 0.59 (*)    All other components within normal limits  CBG MONITORING, ED - Abnormal; Notable for the following components:   Glucose-Capillary 106 (*)    All other components within normal limits  CBC  HEPATIC FUNCTION PANEL  I-STAT BETA HCG BLOOD, ED ([facility], [facility], [facility] ONLY)  TROPONIN I (HIGH SENSITIVITY)  TROPONIN I (HIGH SENSITIVITY)    EKG EKG Interpretation  Date/Time:  Friday December 31 2021 11:50:33 EDT Ventricular Rate:  92 PR Interval:  151 QRS Duration: 94 QT Interval:  351 QTC Calculation: 435 R Axis:   -24 Text Interpretation: Sinus rhythm Probable left atrial enlargement Borderline left axis deviation Abnormal R-wave progression, late transition Nonspecific T abnormalities, anterior leads No significant change was found Confirmed by Glynn Octave [phone]) on 12/31/2021 1:46:01 PM  Radiology CT Angio Chest PE W and/or Wo Contrast  Result Date: 12/31/2021 CLINICAL DATA:  Syncope EXAM: CT ANGIOGRAPHY CHEST WITH CONTRAST TECHNIQUE: Multidetector CT imaging of the chest was performed using the standard protocol during bolus administration of  intravenous contrast. Multiplanar CT image reconstructions and MIPs were obtained to evaluate the vascular anatomy. RADIATION DOSE REDUCTION: This exam was performed according to the departmental dose-optimization program which includes automated exposure control, adjustment of the mA and/or kV according to patient size and/or use of iterative reconstruction technique. CONTRAST:  75 mL OMNIPAQUE IOHEXOL 350 MG/ML SOLN COMPARISON:  None available FINDINGS: Cardiovascular: Heart size within normal limits. No pulmonary artery embolism. No significant abnormality of the thoracic aorta. Mediastinum/Nodes: No enlarged mediastinal, hilar, or axillary lymph nodes. Thyroid gland, trachea, and esophagus demonstrate no significant findings.  Lungs/Pleura: Lungs are clear. No pleural effusion or pneumothorax. Upper Abdomen: No acute abnormality. Musculoskeletal: No chest wall abnormality. No acute or significant osseous findings. Review of the MIP images confirms the above findings. IMPRESSION: No acute abnormality of the chest.  No pulmonary artery embolism. Electronically Signed   By: Acquanetta Belling M.D.   On: 12/31/2021 15:53   CT Head Wo Contrast  Result Date: 12/31/2021 CLINICAL DATA:  Mental status change, unknown cause. Syncopal episode. EXAM: CT HEAD WITHOUT CONTRAST TECHNIQUE: Contiguous axial images were obtained from the base of the skull through the vertex without intravenous contrast. RADIATION DOSE REDUCTION: This exam was performed according to the departmental dose-optimization program which includes automated exposure control, adjustment of the mA and/or kV according to patient size and/or use of iterative reconstruction technique. COMPARISON:  CT head without contrast 07/19/2019 FINDINGS: Brain: No acute infarct, hemorrhage, or mass lesion is present. No significant white matter lesions are present. The ventricles are of normal size. The brainstem and cerebellum are within normal limits. Vascular: No  hyperdense vessel or unexpected calcification. Skull: Calvarium is intact. No focal lytic or blastic lesions are present. No significant extracranial soft tissue lesion is present. Sinuses/Orbits: The paranasal sinuses and mastoid air cells are clear. The globes and orbits are within normal limits. IMPRESSION: Negative CT of the head. Electronically Signed   By: Marin Roberts M.D.   On: 12/31/2021 14:35   DG Chest 2 View  Result Date: 12/31/2021 CLINICAL DATA:  Syncope. EXAM: CHEST - 2 VIEW COMPARISON:  Chest radiograph 07/19/2019 FINDINGS: The heart size and mediastinal contours are within normal limits. Both lungs are clear. The visualized skeletal structures are unremarkable. Negative for a pneumothorax. IMPRESSION: No active cardiopulmonary disease. Electronically Signed   By: Richarda Overlie M.D.   On: 12/31/2021 12:53    Procedures Procedures    Medications Ordered in ED Medications - No data to display  ED Course/ Medical Decision Making/ A&P                           Medical Decision Making Amount and/or Complexity of Data Reviewed Labs: ordered. Decision-making details documented in ED Course. Radiology: ordered and independent interpretation performed. Decision-making details documented in ED Course. ECG/medicine tests: ordered and independent interpretation performed. Decision-making details documented in ED Course.  Risk Prescription drug management.   Episode of near syncope after getting up too quickly.  Sounds vagal vagal in origin.  No neurological deficits.  No chest pain or shortness of breath.  EKG does show T wave inversions anteriorly but this appears to be chronic No brugada, no prolonged QT. Orthostatics negative.  Labs reassuring. Troponin negative x2. Low suspicion for ACS. D-dimer elevated. No PE seen on CTA. Results reviewed and interpreted by me.  Suspect likely vasovagal episode. Low suspicion for cardiac etiology of near syncope but given abnormal EKG,  will refer to cardiology for followup.  Asymptomatic currently, tolerating PO and ambulatory. Return precautions discussed.         Final Clinical Impression(s) / ED Diagnoses Final diagnoses:  Near syncope  Abnormal EKG    Rx / DC Orders ED Discharge Orders     None         Tamanika Heiney, Jeannett Senior, MD 12/31/21 1911

## 2021-12-31 NOTE — Discharge Instructions (Signed)
Your testing is reassuring.  No evidence of heart attack or blood clot in the lung.  Keep yourself hydrated.  Follow-up with your primary doctor as well as cardiologist for a stress test and possible echocardiogram.  Your EKG is abnormal but appears stable to your previous one.  Return to the ED with exertional chest pain, pain associate with shortness of breath, nausea, vomiting, sweating, other concerns.

## 2021-12-31 NOTE — Telephone Encounter (Signed)
Please contact pt to schedule hospital follow-up appt. Thanks

## 2022-01-03 NOTE — Telephone Encounter (Signed)
LVM for patient to call back to schedule a HFU. AMUCK

## 2022-01-04 ENCOUNTER — Telehealth: Payer: Self-pay | Admitting: General Practice

## 2022-01-04 NOTE — Telephone Encounter (Signed)
Transition Care Management Follow-up Telephone Call Date of discharge and from where: 12/31/21 from [facility] How have you been since you were released from the hospital? Patient states that she is feeling fine.  Any questions or concerns? No  Items Reviewed: Did the pt receive and understand the discharge instructions provided? Yes  Medications obtained and verified? Yes  Other? No  Any new allergies since your discharge? No  Dietary orders reviewed? Yes Do you have support at home? Yes   Home Care and Equipment/Supplies: Were home health services ordered? no  Functional Questionnaire: (I = Independent and D = Dependent) ADLs: I  Bathing/Dressing- I  Meal Prep- I  Eating- I  Maintaining continence- I  Transferring/Ambulation- I  Managing Meds- I  Follow up appointments reviewed:  PCP Hospital f/u appt confirmed? Yes  Scheduled to see Dr. Ashley Royalty on 01/10/22. Specialist Hospital f/u appt confirmed? No   Are transportation arrangements needed? No  If their condition worsens, is the pt aware to call PCP or go to the Emergency Dept.? Yes Was the patient provided with contact information for the PCP's office or ED? Yes Was to pt encouraged to call back with questions or concerns? Yes

## 2022-01-10 ENCOUNTER — Ambulatory Visit: Payer: Commercial Managed Care - PPO | Admitting: Family Medicine

## 2022-01-19 ENCOUNTER — Ambulatory Visit: Payer: Commercial Managed Care - PPO | Admitting: Family Medicine

## 2022-01-19 ENCOUNTER — Encounter: Payer: Self-pay | Admitting: Family Medicine

## 2022-01-19 ENCOUNTER — Telehealth: Payer: Self-pay

## 2022-01-19 VITALS — BP 115/83 | HR 99 | Ht 61.0 in | Wt 180.0 lb

## 2022-01-19 DIAGNOSIS — G8929 Other chronic pain: Secondary | ICD-10-CM

## 2022-01-19 DIAGNOSIS — E876 Hypokalemia: Secondary | ICD-10-CM | POA: Diagnosis not present

## 2022-01-19 DIAGNOSIS — I10 Essential (primary) hypertension: Secondary | ICD-10-CM

## 2022-01-19 DIAGNOSIS — R7309 Other abnormal glucose: Secondary | ICD-10-CM | POA: Diagnosis not present

## 2022-01-19 DIAGNOSIS — M545 Low back pain, unspecified: Secondary | ICD-10-CM

## 2022-01-19 DIAGNOSIS — R55 Syncope and collapse: Secondary | ICD-10-CM

## 2022-01-19 NOTE — Assessment & Plan Note (Signed)
Blood pressure is well-controlled at this time.  Recommend continuation of current medications for management of hypertension.   

## 2022-01-19 NOTE — Progress Notes (Signed)
Savannah Randall - 56 y.o. female MRN [id number]  Date of birth: [birth year]  Subjective Chief Complaint  Patient presents with   Hospitalization Follow-up   Back Pain    HPI Savannah Randall is a 56 year old female here today for follow-up visit.  Recently seen in the ED for presyncopal episode.  Reports she was at [employer] office and felt hot.  When she went to stand up felt a little lightheaded.  She does report that she went down to 1 knee however did not lose consciousness.  EMS was contacted and she was transported to the ED.  She denies any feelings of dizziness or lightheadedness.  EKG completed in the ED did not show any acute changes.  She did have some hypokalemia and was given potassium prior to discharge.   Reports that she has felt fine since this incident.   She does continue to have issues with chronic low back pain.  Pain does not radiate however it does affect her performance when driving public transportation.  She is usually able to take a couple days off and use muscle relaxer and naproxen to help when this flares up.  She does need updated FMLA paperwork completed.  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     Social History   Socioeconomic History   Marital status: Married    Spouse name: Not on file   Number of children: Not on file   Years of education: Not on file   Highest education level: Not on file  Occupational History   Not on file  Tobacco Use   Smoking status: Never   Smokeless tobacco: Never  Vaping Use   Vaping Use: Never used   Substance and Sexual Activity   Alcohol use: Not Currently   Drug use: Never   Sexual activity: Yes    Birth control/protection: None  Other Topics Concern   Not on file  Social History Narrative   Not on file   Social Determinants of Health   Financial Resource Strain: Not on file  Food Insecurity: Not on file  Transportation Needs: Not on file  Physical Activity: Not on file  Stress: Not on file  Social Connections: Not on file    Family History  Problem Relation Age of Onset   Diabetes Mother    Heart disease Mother    Heart disease Father     Health Maintenance  Topic Date Due   Zoster Vaccines- Shingrix (1 of 2) 06/06/2022 (Originally 07/28/2013)   INFLUENZA VACCINE  09/04/2022 (Originally 01/04/2022)   COLONOSCOPY (Pts 45-36yrs Insurance coverage will need to be confirmed)  09/29/2022 (Originally 07/28/2008)   Hepatitis C Screening  01/20/2023 (Originally 07/28/1981)   HIV Screening  01/20/2023 (Originally 07/28/1978)   MAMMOGRAM  01/21/2023   PAP SMEAR-Modifier  01/21/2024   TETANUS/TDAP  08/06/2031   HPV VACCINES  Aged Out   COVID-19 Vaccine  Discontinued     ----------------------------------------------------------------------------------------------------------------------------------------------------------------------------------------------------------------- Physical Exam BP 115/83 (BP Location: Left Arm, Patient Position: Sitting, Cuff Size: Large)   Pulse  99   Ht 5\' 1"  (1.549 m)   Wt 180 lb (81.6 kg)   SpO2 98%   BMI 34.01 kg/m   Physical Exam Constitutional:      Appearance: Normal appearance.  Eyes:     General: No scleral icterus. Cardiovascular:     Rate and Rhythm: Normal rate and regular rhythm.  Pulmonary:     Effort: Pulmonary effort is normal.     Breath sounds: Normal breath sounds.  Musculoskeletal:     Cervical back: Neck supple.  Neurological:     General: No focal deficit present.     Mental Status: She is alert.   Psychiatric:        Mood and Affect: Mood normal.        Behavior: Behavior normal.     ------------------------------------------------------------------------------------------------------------------------------------------------------------------------------------------------------------------- Assessment and Plan  Essential hypertension Blood pressure is well controlled at this time.  Recommend continuation of current medications for management of hypertension.  Chronic bilateral low back pain without sciatica She will continue methocarbamol and naproxen as needed.  Recommend she not operate public transportation while using methocarbamol.  FMLA paperwork to be completed to continue intermittent FMLA.  Near syncope Recent episode which seems to be more vasovagal in nature.  She did not lose consciousness during this episode.  I do not think any additional work-up is needed at this time.   No orders of the defined types were placed in this encounter.   Return in about 6 months (around 07/22/2022) for HTN.    This visit occurred during the SARS-CoV-2 public health emergency.  Safety protocols were in place, including screening questions prior to the visit, additional usage of staff PPE, and extensive cleaning of exam room while observing appropriate contact time as indicated for disinfecting solutions.

## 2022-01-19 NOTE — Patient Instructions (Signed)
Continue current medications.  We'll be in touch with lab results.    

## 2022-01-19 NOTE — Assessment & Plan Note (Signed)
She will continue methocarbamol and naproxen as needed.  Recommend she not operate public transportation while using methocarbamol.  FMLA paperwork to be completed to continue intermittent FMLA.

## 2022-01-19 NOTE — Telephone Encounter (Signed)
Patient is requesting a letter stating she is fit for work and that Cardiology is cleared.

## 2022-01-19 NOTE — Assessment & Plan Note (Signed)
Recent episode which seems to be more vasovagal in nature.  She did not lose consciousness during this episode.  I do not think any additional work-up is needed at this time.

## 2022-01-20 LAB — BASIC METABOLIC PANEL
BUN: 16 mg/dL (ref 7–25)
CO2: 29 mmol/L (ref 20–32)
Calcium: 9.7 mg/dL (ref 8.6–10.4)
Chloride: 101 mmol/L (ref 98–110)
Creat: 0.98 mg/dL (ref 0.50–1.03)
Glucose, Bld: 209 mg/dL — ABNORMAL HIGH (ref 65–139)
Potassium: 4.3 mmol/L (ref 3.5–5.3)
Sodium: 140 mmol/L (ref 135–146)

## 2022-01-20 LAB — HEMOGLOBIN A1C
Hgb A1c MFr Bld: 7.3 % of total Hgb — ABNORMAL HIGH (ref <5.7)
Mean Plasma Glucose: 163 mg/dL
eAG (mmol/L): 9 mmol/L

## 2022-01-20 NOTE — Telephone Encounter (Signed)
Letter sent via my chart

## 2022-01-20 NOTE — Telephone Encounter (Signed)
Patient aware letter is available in [place].

## 2022-01-21 ENCOUNTER — Other Ambulatory Visit: Payer: Self-pay | Admitting: Family Medicine

## 2022-01-21 MED ORDER — METFORMIN HCL ER 500 MG PO TB24
500.0000 mg | ORAL_TABLET | Freq: Every day | ORAL | 0 refills | Status: DC
Start: 2022-01-21 — End: 2022-04-21

## 2022-01-27 LAB — HM DIABETES EYE EXAM

## 2022-02-04 ENCOUNTER — Ambulatory Visit: Payer: Commercial Managed Care - PPO | Admitting: Family Medicine

## 2022-03-04 IMAGING — MG MM DIGITAL SCREENING BILAT W/ TOMO AND CAD
8 series · 8 of 24 positions shown · non-contrast
Comparison: None.

CLINICAL DATA: Screening.

EXAM:
DIGITAL SCREENING BILATERAL MAMMOGRAM WITH TOMOSYNTHESIS AND CAD
TECHNIQUE: Bilateral screening digital craniocaudal and mediolateral oblique
mammograms were obtained. Bilateral screening digital breast
tomosynthesis was performed. The images were evaluated with
computer-aided detection.

[L MLO synth-2D]
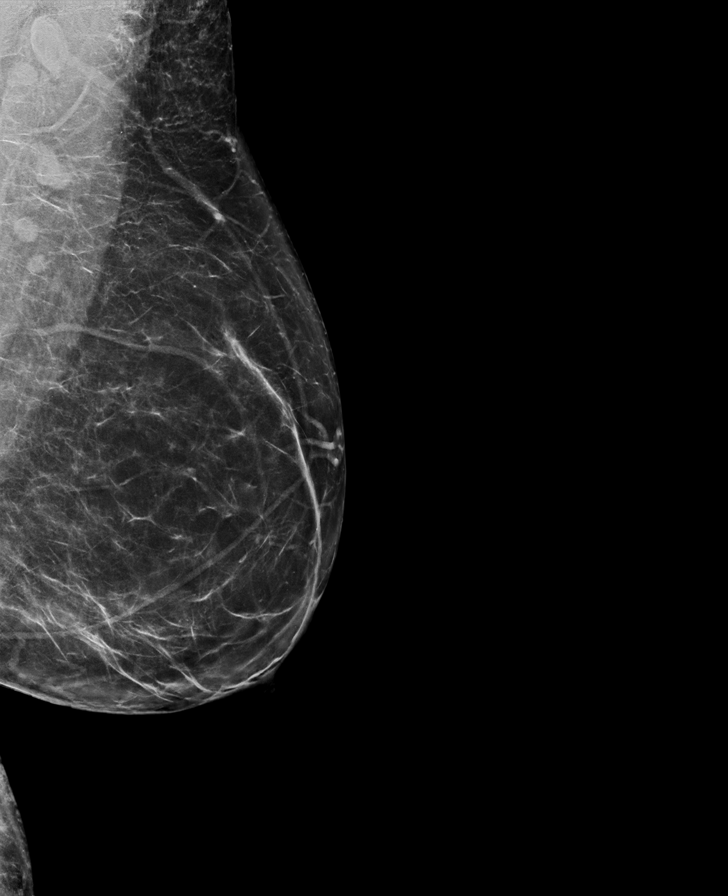

[L CC synth-2D]
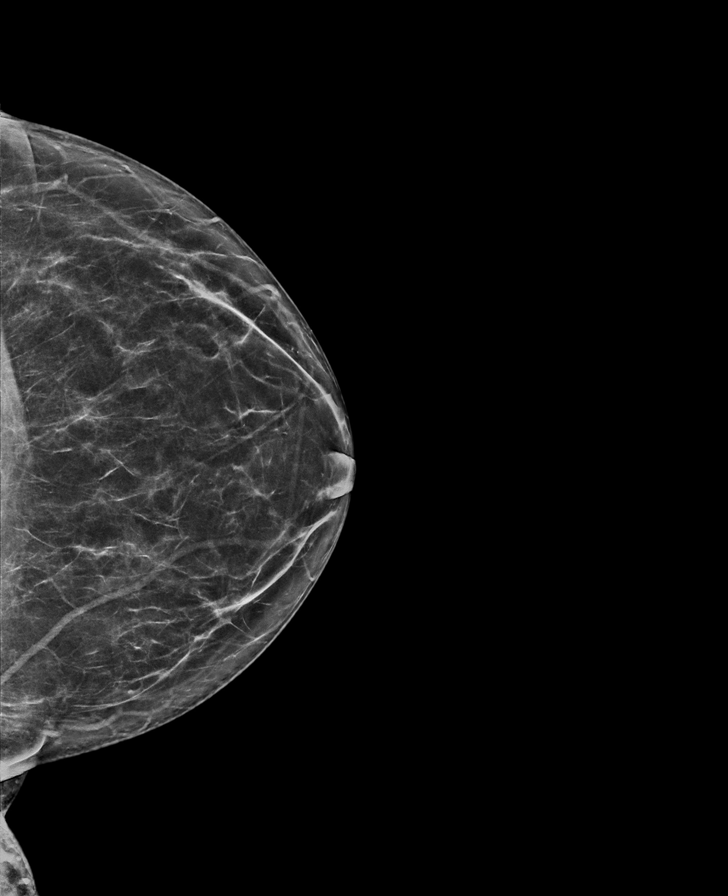

[R MLO synth-2D]
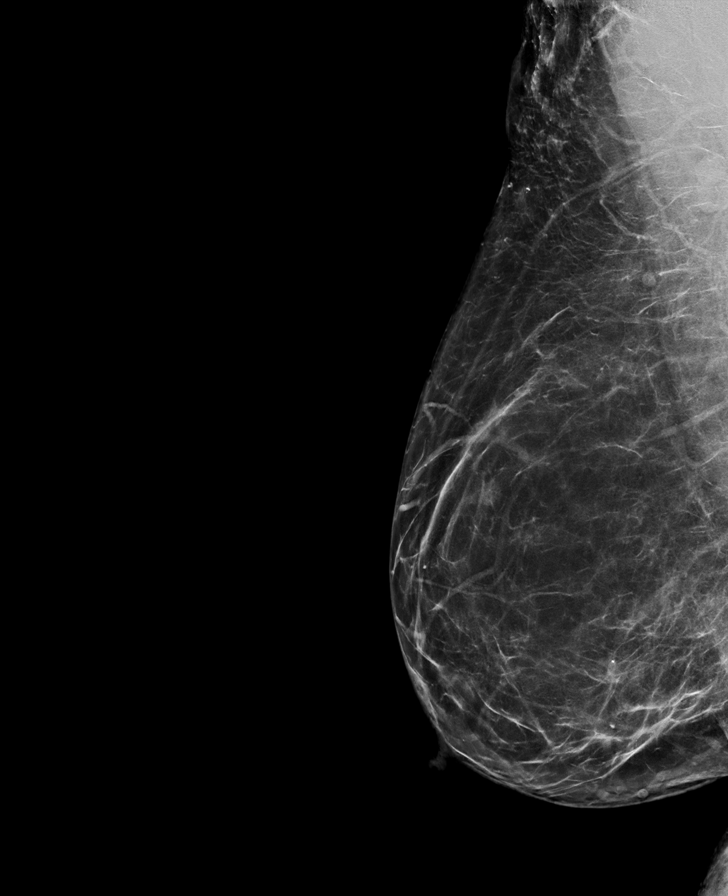

[R CC synth-2D]
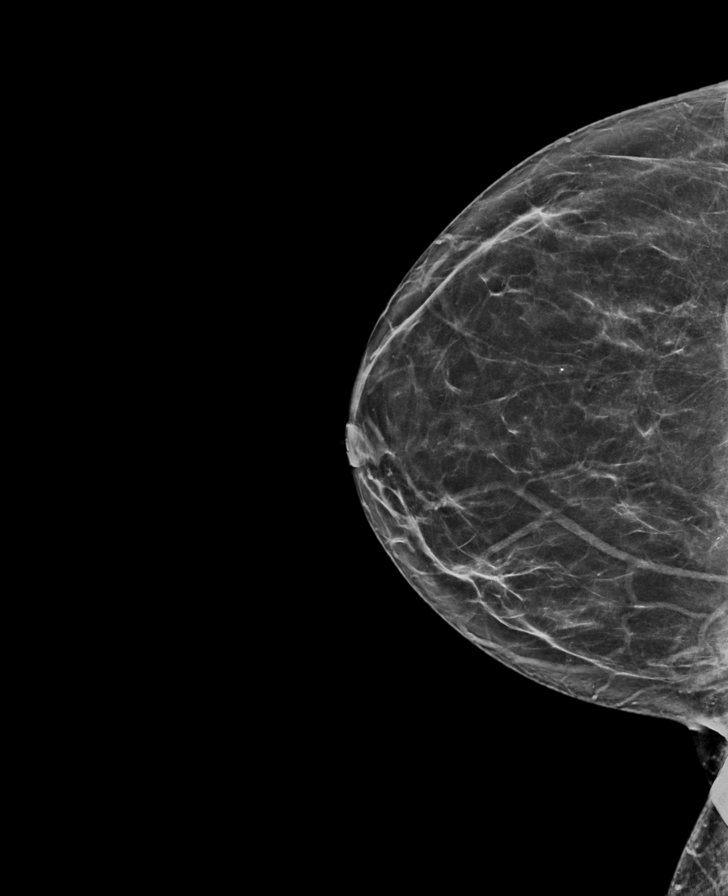

[L MLO tomo · tomo slice 39/76.0]
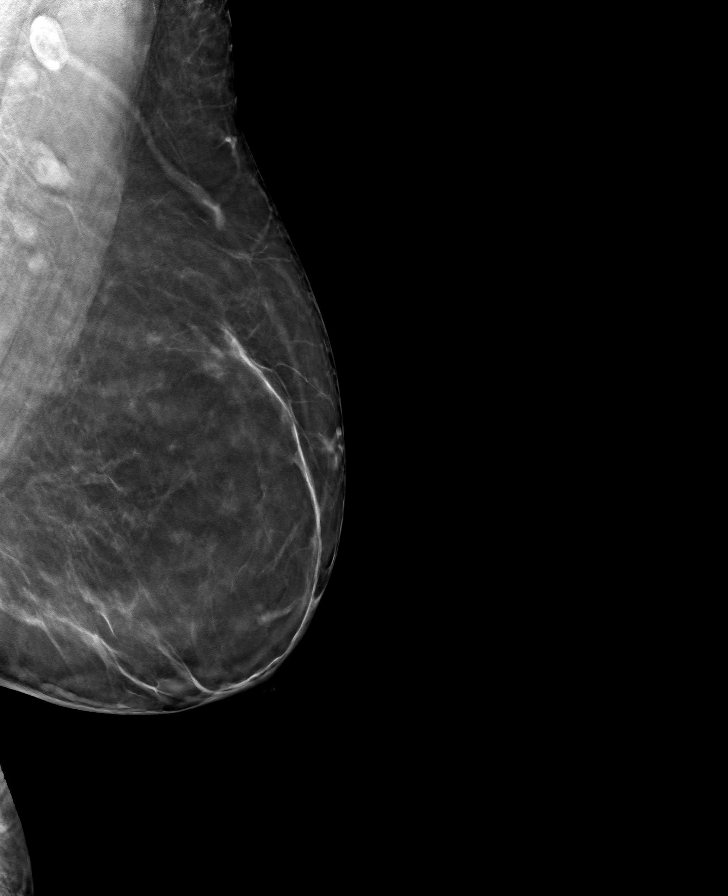

[L CC tomo · tomo slice 37/72.0]
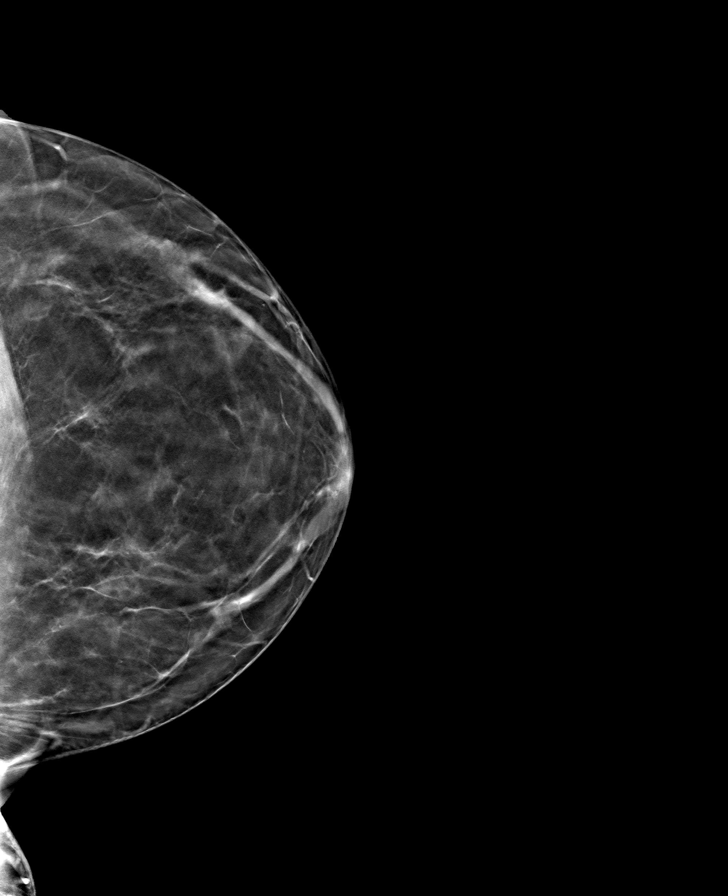

[R CC tomo · tomo slice 35/70.0]
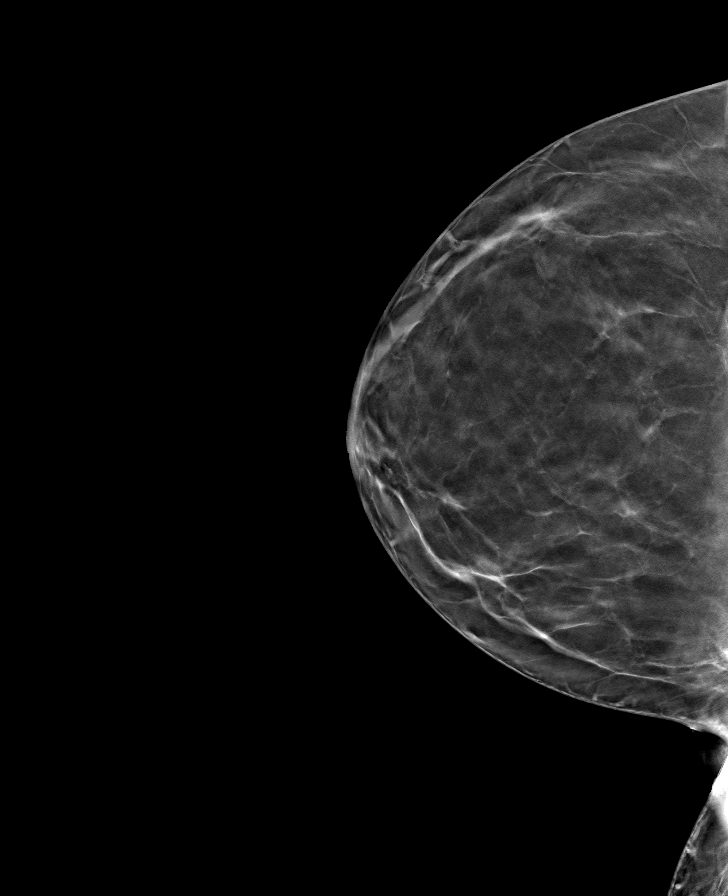

[R MLO tomo · tomo slice 42/83.0]
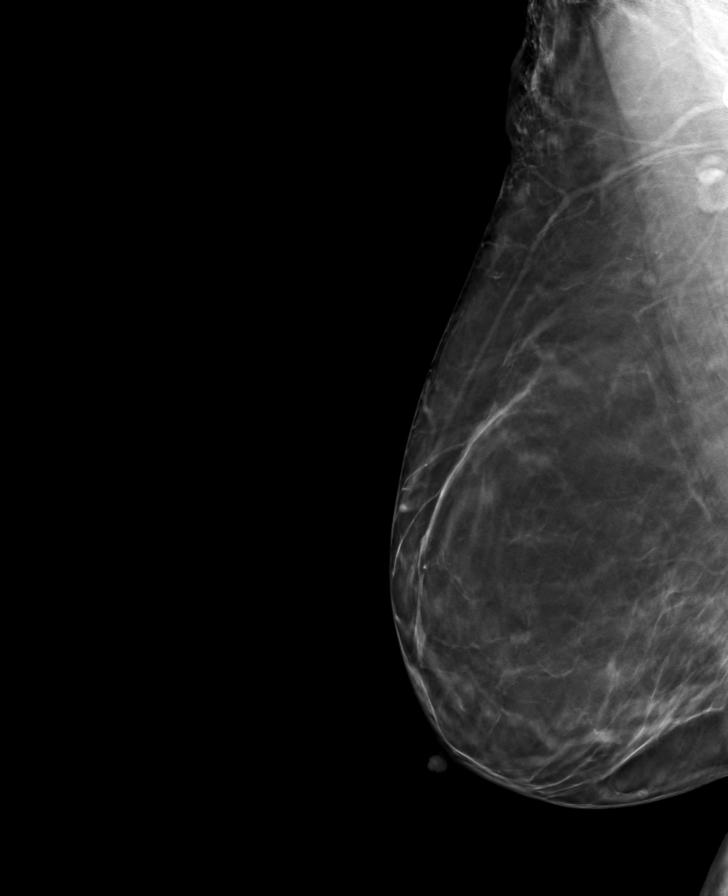

[8 of 24 positions shown; findings below may reference images not displayed]

ACR Breast Density Category b: There are scattered areas of
fibroglandular density.
FINDINGS: There are no findings suspicious for malignancy.
IMPRESSION: No mammographic evidence of malignancy. A result letter of this
screening mammogram will be mailed directly to the patient.

RECOMMENDATION:
Screening mammogram in one year. (Code:XG-X-X7B)

BI-RADS CATEGORY  1: Negative.

## 2022-04-21 ENCOUNTER — Other Ambulatory Visit: Payer: Self-pay | Admitting: Family Medicine

## 2022-04-21 ENCOUNTER — Encounter: Payer: Self-pay | Admitting: Family Medicine

## 2022-04-21 ENCOUNTER — Ambulatory Visit: Payer: Commercial Managed Care - PPO | Admitting: Family Medicine

## 2022-04-21 VITALS — BP 135/86 | HR 78 | Ht 61.0 in | Wt 172.1 lb

## 2022-04-21 DIAGNOSIS — M545 Low back pain, unspecified: Secondary | ICD-10-CM | POA: Diagnosis not present

## 2022-04-21 DIAGNOSIS — E119 Type 2 diabetes mellitus without complications: Secondary | ICD-10-CM

## 2022-04-21 DIAGNOSIS — I10 Essential (primary) hypertension: Secondary | ICD-10-CM | POA: Diagnosis not present

## 2022-04-21 DIAGNOSIS — G8929 Other chronic pain: Secondary | ICD-10-CM

## 2022-04-21 DIAGNOSIS — Z13220 Encounter for screening for lipoid disorders: Secondary | ICD-10-CM

## 2022-04-21 LAB — POCT GLYCOSYLATED HEMOGLOBIN (HGB A1C): HbA1c, POC (controlled diabetic range): 7.6 % — AB (ref 0.0–7.0)

## 2022-04-21 MED ORDER — METHOCARBAMOL 500 MG PO TABS: 500.0000 mg | ORAL_TABLET | Freq: Three times a day (TID) | ORAL | 1 refills | Status: AC | PRN

## 2022-04-21 MED ORDER — BLOOD GLUCOSE MONITOR KIT
PACK | 0 refills | Status: DC
Start: 2022-04-21 — End: 2022-04-21

## 2022-04-21 MED ORDER — RYBELSUS 3 MG PO TABS: 3.0000 mg | ORAL_TABLET | Freq: Every day | ORAL | 0 refills | Status: DC

## 2022-04-21 MED ORDER — METFORMIN HCL ER 500 MG PO TB24: 500.0000 mg | ORAL_TABLET | Freq: Every day | ORAL | 1 refills | Status: DC

## 2022-04-21 MED ORDER — RYBELSUS 7 MG PO TABS
7.0000 mg | ORAL_TABLET | Freq: Every day | ORAL | 1 refills | Status: DC
Start: 2022-04-21 — End: 2022-07-26

## 2022-04-21 NOTE — Progress Notes (Signed)
Savannah Randall - 56 y.o. female MRN [id number]  Date of birth: 06-23-1965  Subjective Chief Complaint  Patient presents with   Follow-up    Follow up on RX    HPI Savannah Randall is a 56 y.o. female here today for follow up visit.   Reports that she is doing ok..  Diabetes diagnosed at last visit with A1c of 7.3%.  Started on metformin.  She is tolerating this pretty well.  She is not currently checking glucose at home. Her weight is down some since last visit.   Continues on amlodipine and chlorthalidone for management of HTN.  She is tolerating current medications well at current strength.  She denies chest pain, shortness of breath, palpitations, headache or vision changes.   Having some increased back pain.  This is chronic in nature.  Has naproxen and robaxin.   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     Social History   Socioeconomic History   Marital status: Married    Spouse name: Not on file   Number of children: Not on file   Years of education: Not on file   Highest education level: Not on file  Occupational History   Not on file  Tobacco Use   Smoking status: Never   Smokeless tobacco: Never  Vaping Use   Vaping Use: Never used  Substance and Sexual Activity   Alcohol use: Not Currently   Drug use: Never   Sexual activity: Yes    Birth control/protection: None  Other Topics Concern   Not on file  Social History Narrative   Not on file   Social Determinants of Health   Financial Resource Strain: Not on file  Food Insecurity: Not on  file  Transportation Needs: Not on file  Physical Activity: Not on file  Stress: Not on file  Social Connections: Not on file    Family History  Problem Relation Age of Onset   Diabetes Mother    Heart disease Mother    Heart disease Father     Health Maintenance  Topic Date Due   OPHTHALMOLOGY EXAM  Never done   Diabetic kidney evaluation - Urine ACR  Never done   Zoster Vaccines- Shingrix (1 of 2) 06/06/2022 (Originally 07/28/2013)   INFLUENZA VACCINE  09/04/2022 (Originally 01/04/2022)   COLONOSCOPY (Pts 45-90yr Insurance coverage will need to be confirmed)  09/29/2022 (Originally 07/28/2008)   Hepatitis C Screening  01/20/2023 (Originally 07/28/1981)   HIV Screening  01/20/2023 (Originally 07/28/1978)   HEMOGLOBIN A1C  07/22/2022   Diabetic kidney evaluation - GFR measurement  01/20/2023   MAMMOGRAM  01/21/2023   FOOT EXAM  04/22/2023   PAP SMEAR-Modifier  01/21/2024   TETANUS/TDAP  08/06/2031   HPV VACCINES  Aged Out   COVID-19 Vaccine  Discontinued     ----------------------------------------------------------------------------------------------------------------------------------------------------------------------------------------------------------------- Physical Exam BP 135/86 (BP Location: Left Arm, Patient Position: Sitting, Cuff Size: Normal)   Pulse 78   Ht 5' 1" (1.549 m)   Wt 172 lb 1.9 oz (78.1 kg)  SpO2 99%   BMI 32.52 kg/m   Physical Exam Constitutional:      Appearance: Normal appearance.  Eyes:     General: No scleral icterus. Cardiovascular:     Rate and Rhythm: Normal rate and regular rhythm.  Pulmonary:     Effort: Pulmonary effort is normal.     Breath sounds: Normal breath sounds.  Neurological:     Mental Status: She is alert.  Psychiatric:        Mood and Affect: Mood normal.        Behavior: Behavior normal.      ------------------------------------------------------------------------------------------------------------------------------------------------------------------------------------------------------------------- Assessment and Plan  Essential hypertension BP is well controlled with current medications. Recommend continuation of current medications.  Low sodium diet encouraged  Type 2 diabetes mellitus without complication, without long-term current use of insulin (HCC) A1c has increased since last visit. Continue metformin at current strength and adding rybelsus 29m x30 days then increase to 732m  Reduced carbohydrate diet encouraged. F/u in 3 months.   Chronic bilateral low back pain without sciatica Adding physical therapy back on.  Robaxin renewed.    Meds ordered this encounter  Medications   Semaglutide (RYBELSUS) 7 MG TABS    Sig: Take 7 mg by mouth daily. Start after completion of 2m65mample pack.    Dispense:  30 tablet    Refill:  1   Semaglutide (RYBELSUS) 3 MG TABS    Sig: Take 3 mg by mouth daily. Lot: [id number]: 01/2023    Dispense:  30 tablet    Refill:  0   methocarbamol (ROBAXIN) 500 MG tablet    Sig: Take 1 tablet (500 mg total) by mouth every 8 (eight) hours as needed for muscle spasms.    Dispense:  90 tablet    Refill:  1   metFORMIN (GLUCOPHAGE-XR) 500 MG 24 hr tablet    Sig: Take 1 tablet (500 mg total) by mouth daily with breakfast.    Dispense:  90 tablet    Refill:  1   blood glucose meter kit and supplies KIT    Sig: Dispense based on patient and insurance preference. Use to check glucose once daily.    Dispense:  1 each    Refill:  0    Order Specific Question:   Number of strips    Answer:   100    Order Specific Question:   Number of lancets    Answer:   100    No follow-ups on file.    This visit occurred during the SARS-CoV-2 public health emergency.  Safety protocols were in place, including screening questions prior to the visit,  additional usage of staff PPE, and extensive cleaning of exam room while observing appropriate contact time as indicated for disinfecting solutions.

## 2022-04-21 NOTE — Assessment & Plan Note (Signed)
Adding physical therapy back on.  Robaxin renewed.

## 2022-04-21 NOTE — Patient Instructions (Signed)
Continue with metformin at current strength.  Add rybelsus 3mg  x30 days then increase to 7mg .     Diabetes Mellitus and Nutrition, Adult When you have diabetes, or diabetes mellitus, it is very important to have healthy eating habits because your blood sugar (glucose) levels are greatly affected by what you eat and drink. Eating healthy foods in the right amounts, at about the same times every day, can help you: Manage your blood glucose. Lower your risk of heart disease. Improve your blood pressure. Reach or maintain a healthy weight. What can affect my meal plan? Every person with diabetes is different, and each person has different needs for a meal plan. Your health care provider may recommend that you work with a dietitian to make a meal plan that is best for you. Your meal plan may vary depending on factors such as: The calories you need. The medicines you take. Your weight. Your blood glucose, blood pressure, and cholesterol levels. Your activity level. Other health conditions you have, such as heart or kidney disease. How do carbohydrates affect me? Carbohydrates, also called carbs, affect your blood glucose level more than any other type of food. Eating carbs raises the amount of glucose in your blood. It is important to know how many carbs you can safely have in each meal. This is different for every person. Your dietitian can help you calculate how many carbs you should have at each meal and for each snack. How does alcohol affect me? Alcohol can cause a decrease in blood glucose (hypoglycemia), especially if you use insulin or take certain diabetes medicines by mouth. Hypoglycemia can be a life-threatening condition. Symptoms of hypoglycemia, such as sleepiness, dizziness, and confusion, are similar to symptoms of having too much alcohol. Do not drink alcohol if: Your health care provider tells you not to drink. You are pregnant, may be pregnant, or are planning to become  pregnant. If you drink alcohol: Limit how much you have to: 0-1 drink a day for women. 0-2 drinks a day for men. Know how much alcohol is in your drink. In the [place] one drink equals one 12 oz bottle of beer (355 mL), one 5 oz glass of wine (148 mL), or one 1 oz glass of hard liquor (44 mL). Keep yourself hydrated with water, diet soda, or unsweetened iced tea. Keep in mind that regular soda, juice, and other mixers may contain a lot of sugar and must be counted as carbs. What are tips for following this plan?  Reading food labels Start by checking the serving size on the Nutrition Facts label of packaged foods and drinks. The number of calories and the amount of carbs, fats, and other nutrients listed on the label are based on one serving of the item. Many items contain more than one serving per package. Check the total grams (g) of carbs in one serving. Check the number of grams of saturated fats and trans fats in one serving. Choose foods that have a low amount or none of these fats. Check the number of milligrams (mg) of salt (sodium) in one serving. Most people should limit total sodium intake to less than 2,300 mg per day. Always check the nutrition information of foods labeled as "low-fat" or "nonfat." These foods may be higher in added sugar or refined carbs and should be avoided. Talk to your dietitian to identify your daily goals for nutrients listed on the label. Shopping Avoid buying canned, pre-made, or processed foods. These foods tend to be  high in fat, sodium, and added sugar. Shop around the outside edge of the grocery store. This is where you will most often find fresh fruits and vegetables, bulk grains, fresh meats, and fresh dairy products. Cooking Use low-heat cooking methods, such as baking, instead of high-heat cooking methods, such as deep frying. Cook using healthy oils, such as olive, canola, or sunflower oil. Avoid cooking with butter, cream, or high-fat meats. Meal  planning Eat meals and snacks regularly, preferably at the same times every day. Avoid going long periods of time without eating. Eat foods that are high in fiber, such as fresh fruits, vegetables, beans, and whole grains. Eat 4-6 oz (112-168 g) of lean protein each day, such as lean meat, chicken, fish, eggs, or tofu. One ounce (oz) (28 g) of lean protein is equal to: 1 oz (28 g) of meat, chicken, or fish. 1 egg.  cup (62 g) of tofu. Eat some foods each day that contain healthy fats, such as avocado, nuts, seeds, and fish. What foods should I eat? Fruits Berries. Apples. Oranges. Peaches. Apricots. Plums. Grapes. Mangoes. Papayas. Pomegranates. Kiwi. Cherries. Vegetables Leafy greens, including lettuce, spinach, kale, chard, collard greens, mustard greens, and cabbage. Beets. Cauliflower. Broccoli. Carrots. Green beans. Tomatoes. Peppers. Onions. Cucumbers. Brussels sprouts. Grains Whole grains, such as whole-wheat or whole-grain bread, crackers, tortillas, cereal, and pasta. Unsweetened oatmeal. Quinoa. Brown or wild rice. Meats and other proteins Seafood. Poultry without skin. Lean cuts of poultry and beef. Tofu. Nuts. Seeds. Dairy Low-fat or fat-free dairy products such as milk, yogurt, and cheese. The items listed above may not be a complete list of foods and beverages you can eat and drink. Contact a dietitian for more information. What foods should I avoid? Fruits Fruits canned with syrup. Vegetables Canned vegetables. Frozen vegetables with butter or cream sauce. Grains Refined white flour and flour products such as bread, pasta, snack foods, and cereals. Avoid all processed foods. Meats and other proteins Fatty cuts of meat. Poultry with skin. Breaded or fried meats. Processed meat. Avoid saturated fats. Dairy Full-fat yogurt, cheese, or milk. Beverages Sweetened drinks, such as soda or iced tea. The items listed above may not be a complete list of foods and beverages you  should avoid. Contact a dietitian for more information. Questions to ask a health care provider Do I need to meet with a certified diabetes care and education specialist? Do I need to meet with a dietitian? What number can I call if I have questions? When are the best times to check my blood glucose? Where to find more information: American Diabetes Association: diabetes.org Academy of Nutrition and Dietetics: eatright[employer] of Diabetes and Digestive and Kidney Diseases: StageSync.si Association of Diabetes Care & Education Specialists: diabeteseducator.org Summary It is important to have healthy eating habits because your blood sugar (glucose) levels are greatly affected by what you eat and drink. It is important to use alcohol carefully. A healthy meal plan will help you manage your blood glucose and lower your risk of heart disease. Your health care provider may recommend that you work with a dietitian to make a meal plan that is best for you. This information is not intended to replace advice given to you by your health care provider. Make sure you discuss any questions you have with your health care provider. Document Revised: 12/25/2019 Document Reviewed: 12/25/2019 Elsevier Patient Education  2023 [employer].

## 2022-04-21 NOTE — Assessment & Plan Note (Signed)
BP is well controlled with current medications. Recommend continuation of current medications.  Low sodium diet encouraged

## 2022-04-21 NOTE — Assessment & Plan Note (Signed)
A1c has increased since last visit. Continue metformin at current strength and adding rybelsus 3mg  x30 days then increase to 7mg .  Reduced carbohydrate diet encouraged. F/u in 3 months.

## 2022-05-11 ENCOUNTER — Encounter: Payer: Self-pay | Admitting: Family Medicine

## 2022-05-20 ENCOUNTER — Telehealth: Payer: Self-pay

## 2022-05-20 NOTE — Telephone Encounter (Signed)
Pt lvm stating sore throat and flu symptoms. Requesting MyChart visit. Patient should probably have in-office testing. Please call to schedule.

## 2022-05-20 NOTE — Telephone Encounter (Signed)
If worsening recommend evaluation at urgent care.

## 2022-05-23 ENCOUNTER — Ambulatory Visit: Payer: Commercial Managed Care - PPO | Admitting: Family Medicine

## 2022-05-23 ENCOUNTER — Encounter: Payer: Self-pay | Admitting: Family Medicine

## 2022-05-23 VITALS — BP 108/76 | HR 105 | Ht 61.0 in | Wt 170.0 lb

## 2022-05-23 DIAGNOSIS — J069 Acute upper respiratory infection, unspecified: Secondary | ICD-10-CM | POA: Insufficient documentation

## 2022-05-23 NOTE — Assessment & Plan Note (Signed)
Symptoms have improved and nearly resolved at this point.  Recommend continued supportive care with increase fluids, humidifier and over-the-counter symptomatic treatment.  We discussed avoiding decongestants with her history of hypertension.

## 2022-05-23 NOTE — Progress Notes (Signed)
Savannah Randall - 56 y.o. female MRN [id number]  Date of birth: 10/29/1965  Subjective Chief Complaint  Patient presents with   Hypertension   URI    HPI Savannah Randall is a 56-year-old female here today with complaint of URI symptoms.  Last week she had sinus pressure, cough, congestion and headache.  She tried conservative treatment over the weekend and had improvement.  Symptoms really resolved at this point.  Still with mild lingering cough.  Denies wheezing or shortness of breath.  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     Social History   Socioeconomic History   Marital status: Married    Spouse name: Not on file   Number of children: Not on file   Years of education: Not on file   Highest education level: Not on file  Occupational History   Not on file  Tobacco Use   Smoking status: Never   Smokeless tobacco: Never  Vaping Use   Vaping Use: Never used  Substance and Sexual Activity   Alcohol use: Not Currently   Drug use: Never   Sexual activity: Yes    Birth control/protection: None  Other Topics Concern   Not on file  Social History Narrative   Not on file   Social Determinants of Health   Financial Resource Strain: Not on file  Food Insecurity: Not on file  Transportation Needs: Not on file  Physical Activity: Not on file  Stress: Not on file  Social Connections: Not on file    Family History  Problem Relation Age of Onset   Diabetes Mother    Heart disease Mother    Heart disease Father     Health Maintenance  Topic Date  Due   Diabetic kidney evaluation - Urine ACR  Never done   Zoster Vaccines- Shingrix (1 of 2) 06/06/2022 (Originally 07/28/2013)   INFLUENZA VACCINE  09/04/2022 (Originally 01/04/2022)   COLONOSCOPY (Pts 45-49yrs Insurance coverage will need to be confirmed)  09/29/2022 (Originally 07/28/2008)   Hepatitis C Screening  01/20/2023 (Originally 07/28/1981)   HIV Screening  01/20/2023 (Originally 07/28/1978)   HEMOGLOBIN A1C  10/20/2022   Diabetic kidney evaluation - eGFR measurement  01/20/2023   MAMMOGRAM  01/21/2023   OPHTHALMOLOGY EXAM  01/28/2023   FOOT EXAM  04/22/2023   PAP SMEAR-Modifier  01/21/2024   DTaP/Tdap/Td (2 - Td or Tdap) 08/06/2031   HPV VACCINES  Aged Out   COVID-19 Vaccine  Discontinued     ----------------------------------------------------------------------------------------------------------------------------------------------------------------------------------------------------------------- Physical Exam BP 108/76 (BP Location: Right Arm, Patient Position: Sitting, Cuff Size: Normal)   Pulse (!) 105   Ht 5' 1" (1.549 m)   Wt 170 lb (77.1 kg)   SpO2 96%   BMI 32.12 kg/m   Physical Exam Constitutional:      Appearance: Normal appearance.  HENT:     Head: Normocephalic and atraumatic.  Eyes:     General: No scleral icterus. Cardiovascular:     Heart sounds: Normal heart sounds.  Pulmonary:     Effort: Pulmonary   effort is normal.     Breath sounds: Normal breath sounds.  Neurological:     Mental Status: She is alert.  Psychiatric:        Mood and Affect: Mood normal.        Behavior: Behavior normal.     ------------------------------------------------------------------------------------------------------------------------------------------------------------------------------------------------------------------- Assessment and Plan  Upper respiratory infection Symptoms have improved and nearly resolved at this point.  Recommend continued supportive care  with increase fluids, humidifier and over-the-counter symptomatic treatment.  We discussed avoiding decongestants with her history of hypertension.   No orders of the defined types were placed in this encounter.   No follow-ups on file.    This visit occurred during the SARS-CoV-2 public health emergency.  Safety protocols were in place, including screening questions prior to the visit, additional usage of staff PPE, and extensive cleaning of exam room while observing appropriate contact time as indicated for disinfecting solutions.   

## 2022-07-16 ENCOUNTER — Other Ambulatory Visit: Payer: Self-pay | Admitting: Family Medicine

## 2022-07-19 ENCOUNTER — Telehealth: Payer: Self-pay

## 2022-07-19 NOTE — Telephone Encounter (Signed)
Pt would like to know the cost for an office visit with Dr. Zigmund Daniel. She is currently without insurance and has a Friday appt.   Please contact the patient asap.

## 2022-07-20 ENCOUNTER — Telehealth: Payer: Self-pay

## 2022-07-20 NOTE — Telephone Encounter (Signed)
Spoke to patient. She states that she's been playing phone tag with the office concerning self-pay pricing. She rescheduled Friday appt until March when insurance resumes.   Advised pt that self-pay office visit is $75. She states that she would have paid the amount but she didn't hear back from the office in time.

## 2022-07-20 NOTE — Telephone Encounter (Signed)
Terri spoke with her yesterday and I left a message on her voicemail today. Tvt

## 2022-07-20 NOTE — Telephone Encounter (Signed)
There's a phone note in there that Savannah Randall spoke with patient yesterday about this and Savannah Randall spoke with patient yesterday

## 2022-07-22 ENCOUNTER — Ambulatory Visit: Payer: Commercial Managed Care - PPO | Admitting: Family Medicine

## 2022-07-26 ENCOUNTER — Other Ambulatory Visit: Payer: Self-pay | Admitting: Family Medicine

## 2022-08-08 ENCOUNTER — Ambulatory Visit: Payer: 59 | Admitting: Family Medicine

## 2022-08-08 ENCOUNTER — Encounter: Payer: Self-pay | Admitting: Family Medicine

## 2022-08-08 VITALS — BP 117/84 | HR 85 | Ht 60.0 in | Wt 165.0 lb

## 2022-08-08 DIAGNOSIS — I10 Essential (primary) hypertension: Secondary | ICD-10-CM

## 2022-08-08 DIAGNOSIS — E782 Mixed hyperlipidemia: Secondary | ICD-10-CM | POA: Diagnosis not present

## 2022-08-08 DIAGNOSIS — E119 Type 2 diabetes mellitus without complications: Secondary | ICD-10-CM | POA: Diagnosis not present

## 2022-08-08 NOTE — Progress Notes (Signed)
Savannah Randall - 57 y.o. female MRN [id number]  Date of birth: [birth year]  Subjective Chief Complaint  Patient presents with   Follow-up    HPI Savannah Randall is a 57 y.o. female here today for follow up visit.   She has done pretty well with Rybelsus and is tolerating well at '7mg'$  strength.  Continues on metformin as well. Weight is down about 4-5 lbs since last visit.  She is not checking blood sugars at home.  She does walk some to stay active.   She continues on amlodipine and chlorthalidone for management of HTN.  BP is well controlled.  She denies new symptoms related to her HTN including chest pain, shortness of breath, palpitations, headache or vision changes.    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     Social History   Socioeconomic History   Marital status: Married    Spouse name: Not on file   Number of children: Not on file   Years of education: Not on file   Highest education level: Not on file  Occupational History   Not on file  Tobacco Use   Smoking status: Never   Smokeless tobacco: Never  Vaping Use   Vaping Use: Never used  Substance and Sexual Activity   Alcohol use: Not Currently   Drug use: Never   Sexual activity: Yes    Birth control/protection: None  Other Topics Concern   Not on file  Social History Narrative   Not on file   Social Determinants of Health   Financial Resource Strain: Not on file  Food Insecurity: Not on file  Transportation Needs: Not on file  Physical Activity: Not on file  Stress: Not on file   Social Connections: Not on file    Family History  Problem Relation Age of Onset   Diabetes Mother    Heart disease Mother    Heart disease Father     Health Maintenance  Topic Date Due   Diabetic kidney evaluation - Urine ACR  Never done   Zoster Vaccines- Shingrix (1 of 2) Never done   INFLUENZA VACCINE  09/04/2022 (Originally 01/04/2022)   COLONOSCOPY (Pts 45-80yr Insurance coverage will need to be confirmed)  09/29/2022 (Originally 07/28/2008)   Hepatitis C Screening  01/20/2023 (Originally 07/28/1981)   HIV Screening  01/20/2023 (Originally 07/28/1978)   HEMOGLOBIN A1C  10/20/2022   Diabetic kidney evaluation - eGFR measurement  01/20/2023   MAMMOGRAM  01/21/2023   OPHTHALMOLOGY EXAM  01/28/2023   FOOT EXAM  04/22/2023   PAP SMEAR-Modifier  01/21/2024   DTaP/Tdap/Td (2 - Td or Tdap) 08/06/2031   HPV VACCINES  Aged Out   COVID-19 Vaccine  Discontinued     ----------------------------------------------------------------------------------------------------------------------------------------------------------------------------------------------------------------- Physical Exam BP 117/84   Pulse 85   Ht 5' (1.524 m)   Wt 165 lb 0.6 oz (74.9 kg)   SpO2 99%   BMI 32.23 kg/m   Physical Exam Constitutional:      Appearance: Normal appearance.  HENT:     Head:  Normocephalic and atraumatic.  Eyes:     General: No scleral icterus. Cardiovascular:     Rate and Rhythm: Normal rate and regular rhythm.  Pulmonary:     Effort: Pulmonary effort is normal.     Breath sounds: Normal breath sounds.  Musculoskeletal:     Cervical back: Neck supple.  Neurological:     Mental Status: She is alert.  Psychiatric:        Mood and Affect: Mood normal.        Behavior: Behavior normal.      ------------------------------------------------------------------------------------------------------------------------------------------------------------------------------------------------------------------- Assessment and Plan  Essential hypertension BP remains well controlled.  Recommend continuation of current medications for management of HTN.    Type 2 diabetes mellitus without complication, without long-term current use of insulin (HCC) Updating A1c today.  Doing well with Rybelsus. Will plan to continue this along with metformin.  Dietary changes encouraged as well.    No orders of the defined types were placed in this encounter.   Return in about 6 months (around 02/08/2023) for HTN/T2DM.    This visit occurred during the SARS-CoV-2 public health emergency.  Safety protocols were in place, including screening questions prior to the visit, additional usage of staff PPE, and extensive cleaning of exam room while observing appropriate contact time as indicated for disinfecting solutions.

## 2022-08-08 NOTE — Assessment & Plan Note (Signed)
BP remains well controlled.  Recommend continuation of current medications for management of HTN.

## 2022-08-08 NOTE — Assessment & Plan Note (Signed)
Updating A1c today.  Doing well with Rybelsus. Will plan to continue this along with metformin.  Dietary changes encouraged as well.

## 2022-08-09 LAB — COMPLETE METABOLIC PANEL WITH GFR
AG Ratio: 1.1 (calc) (ref 1.0–2.5)
ALT: 13 U/L (ref 6–29)
AST: 8 U/L — ABNORMAL LOW (ref 10–35)
Albumin: 4 g/dL (ref 3.6–5.1)
Alkaline phosphatase (APISO): 90 U/L (ref 37–153)
BUN: 13 mg/dL (ref 7–25)
CO2: 30 mmol/L (ref 20–32)
Calcium: 9.5 mg/dL (ref 8.6–10.4)
Chloride: 103 mmol/L (ref 98–110)
Creat: 0.87 mg/dL (ref 0.50–1.03)
Globulin: 3.5 g/dL (calc) (ref 1.9–3.7)
Glucose, Bld: 118 mg/dL — ABNORMAL HIGH (ref 65–99)
Potassium: 4.1 mmol/L (ref 3.5–5.3)
Sodium: 141 mmol/L (ref 135–146)
Total Bilirubin: 0.3 mg/dL (ref 0.2–1.2)
Total Protein: 7.5 g/dL (ref 6.1–8.1)
eGFR: 77 mL/min/{1.73_m2} (ref >=60)

## 2022-08-09 LAB — CBC WITH DIFFERENTIAL/PLATELET
Absolute Monocytes: 390 cells/uL (ref 200–950)
Basophils Absolute: 38 cells/uL (ref 0–200)
Basophils Relative: 0.6 %
Eosinophils Absolute: 70 cells/uL (ref 15–500)
Eosinophils Relative: 1.1 %
HCT: 36.7 % (ref 35.0–45.0)
Hemoglobin: 12 g/dL (ref 11.7–15.5)
Lymphs Abs: 1453 cells/uL (ref 850–3900)
MCH: 28.4 pg (ref 27.0–33.0)
MCHC: 32.7 g/dL (ref 32.0–36.0)
MCV: 86.8 fL (ref 80.0–100.0)
MPV: 10.3 fL (ref 7.5–12.5)
Monocytes Relative: 6.1 %
Neutro Abs: 4448 cells/uL (ref 1500–7800)
Neutrophils Relative %: 69.5 %
Platelets: 351 10*3/uL (ref 140–400)
RBC: 4.23 10*6/uL (ref 3.80–5.10)
RDW: 13.5 % (ref 11.0–15.0)
Total Lymphocyte: 22.7 %
WBC: 6.4 10*3/uL (ref 3.8–10.8)

## 2022-08-09 LAB — LIPID PANEL W/REFLEX DIRECT LDL
Cholesterol: 242 mg/dL — ABNORMAL HIGH (ref <200)
HDL: 46 mg/dL — ABNORMAL LOW (ref >=50)
LDL Cholesterol (Calc): 173 mg/dL (calc) — ABNORMAL HIGH
Non-HDL Cholesterol (Calc): 196 mg/dL (calc) — ABNORMAL HIGH (ref <130)
Total CHOL/HDL Ratio: 5.3 (calc) — ABNORMAL HIGH (ref <5.0)
Triglycerides: 105 mg/dL (ref <150)

## 2022-08-09 LAB — MICROALBUMIN, URINE: Microalb, Ur: 0.3 mg/dL

## 2022-08-09 LAB — HEMOGLOBIN A1C
Hgb A1c MFr Bld: 7 % of total Hgb — ABNORMAL HIGH (ref <5.7)
Mean Plasma Glucose: 154 mg/dL
eAG (mmol/L): 8.5 mmol/L

## 2022-08-19 ENCOUNTER — Other Ambulatory Visit: Payer: Self-pay | Admitting: Family Medicine

## 2022-08-19 MED ORDER — ATORVASTATIN CALCIUM 20 MG PO TABS: 20.0000 mg | ORAL_TABLET | Freq: Every day | ORAL | 3 refills | Status: AC

## 2022-11-10 IMAGING — US US EXTREM LOW VENOUS*L*
1 series · 14 of 24 positions shown · non-contrast
Comparison: None.

CLINICAL DATA: Left calf pain. Superficial thrombophlebitis versus
DVT.

EXAM:
LEFT LOWER EXTREMITY VENOUS DOPPLER ULTRASOUND
TECHNIQUE: Gray-scale sonography with compression, as well as color and duplex
ultrasound, were performed to evaluate the deep venous system(s)
from the level of the common femoral vein through the popliteal and
proximal calf veins.

[Series 1: us venous img lower uni left (dvt) · portal-venous · 14 of 41 slices shown]
[im 1/41]
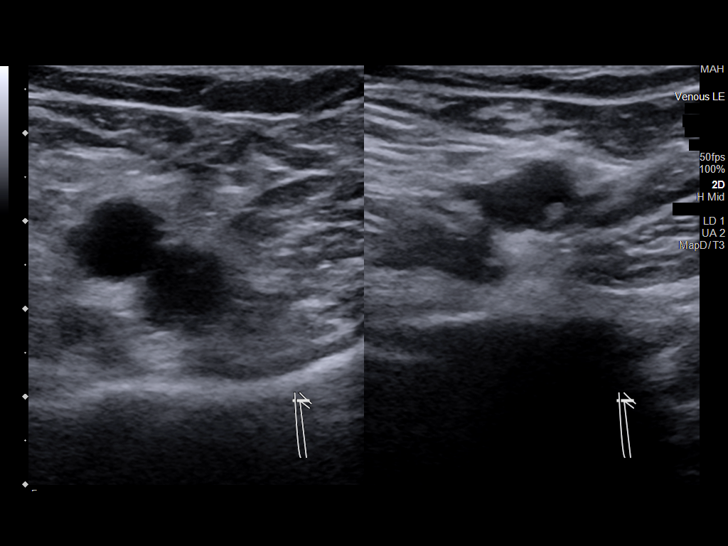
[im 4/41]
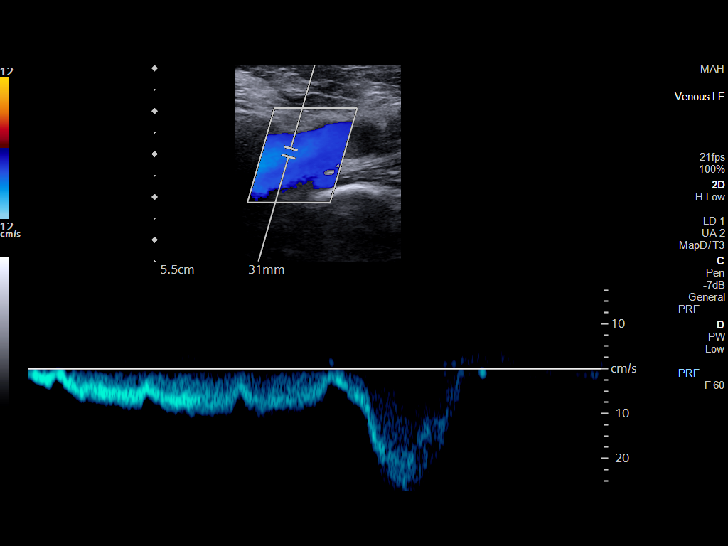
[im 7/41]
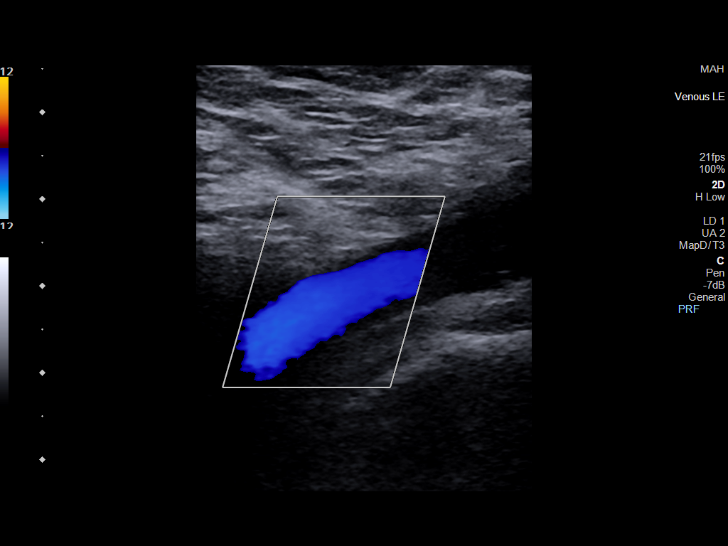
[im 11/41]
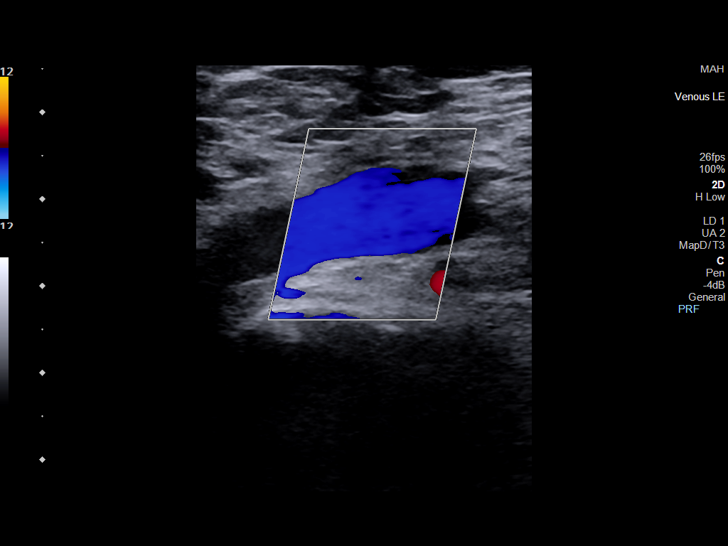
[im 13/41]
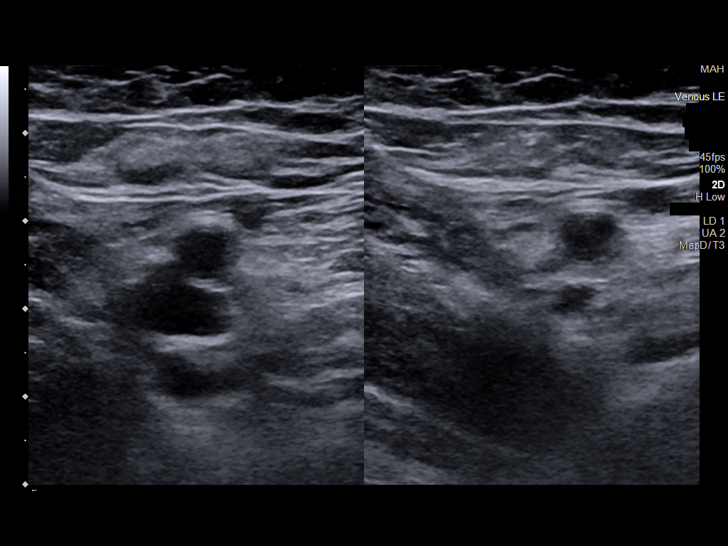
[im 16/41]
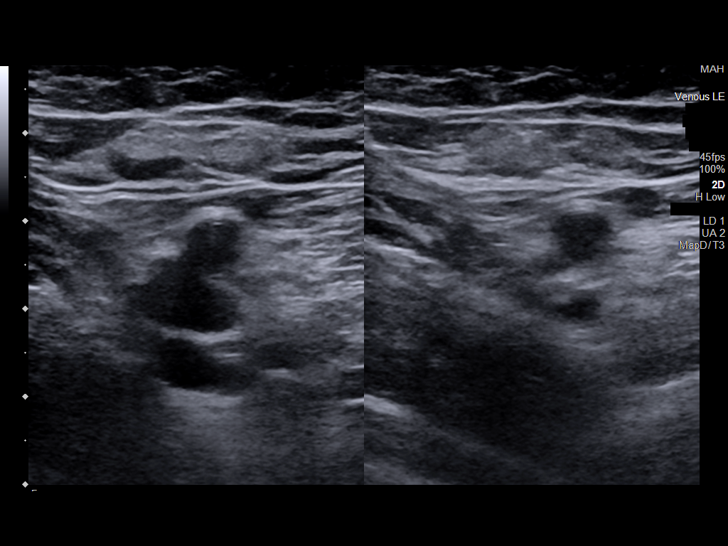
[im 20/41]
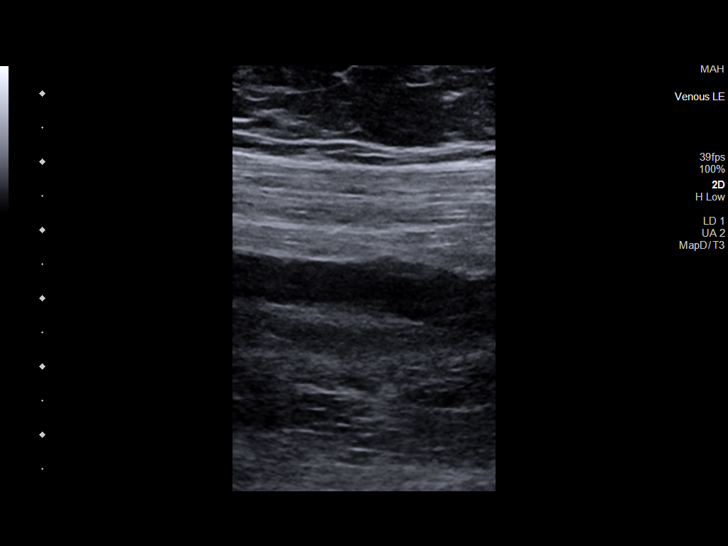
[im 21/41]
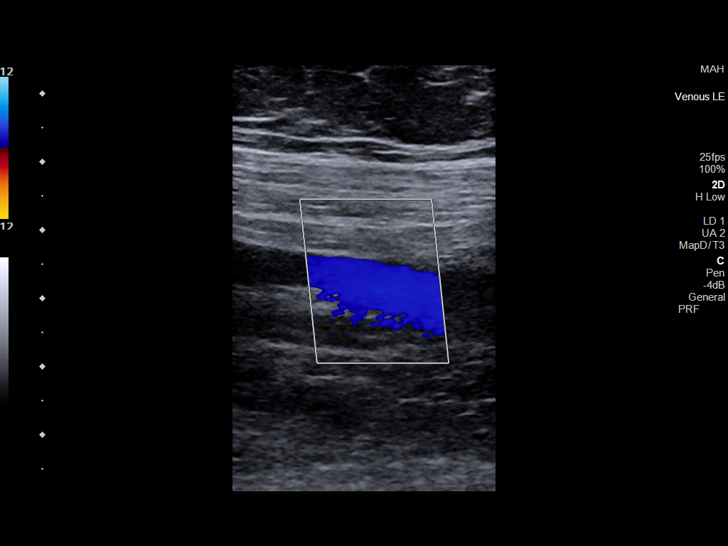
[im 25/41]
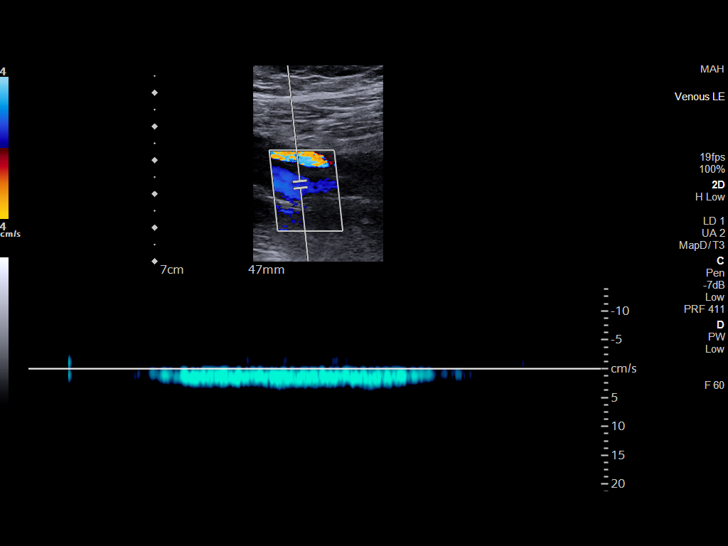
[im 28/41]
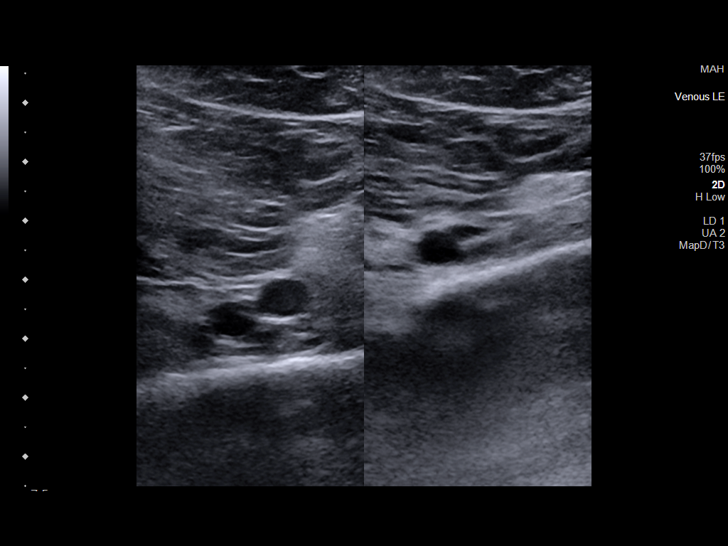
[im 32/41]
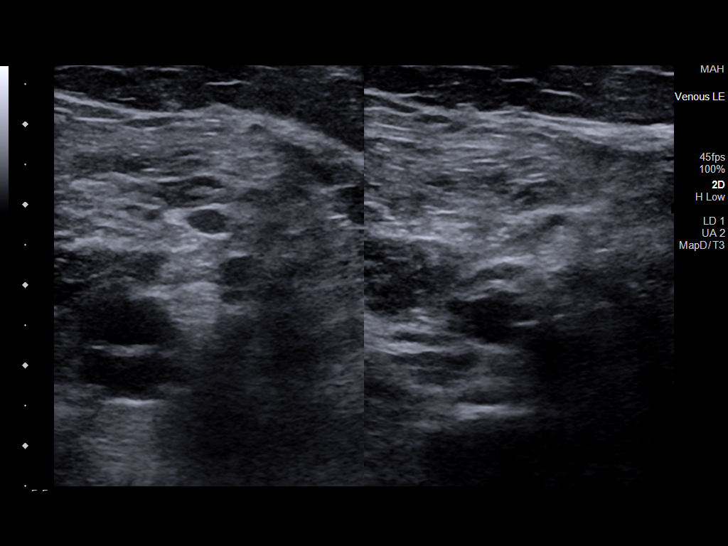
[im 34/41]
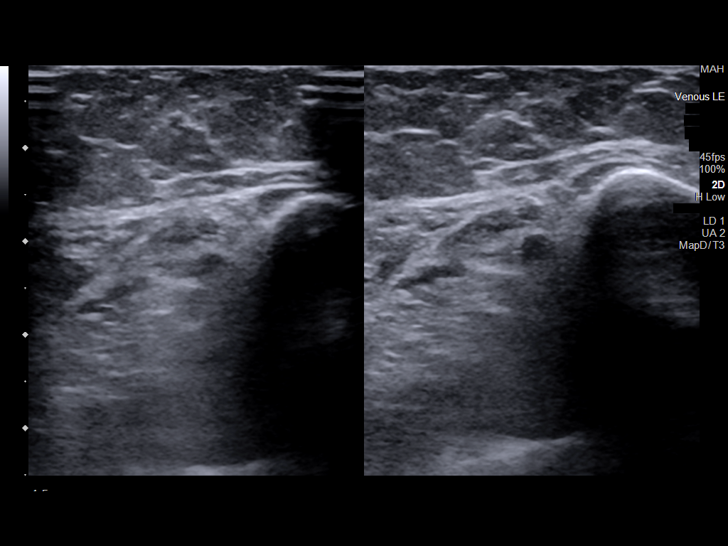
[im 37/41]
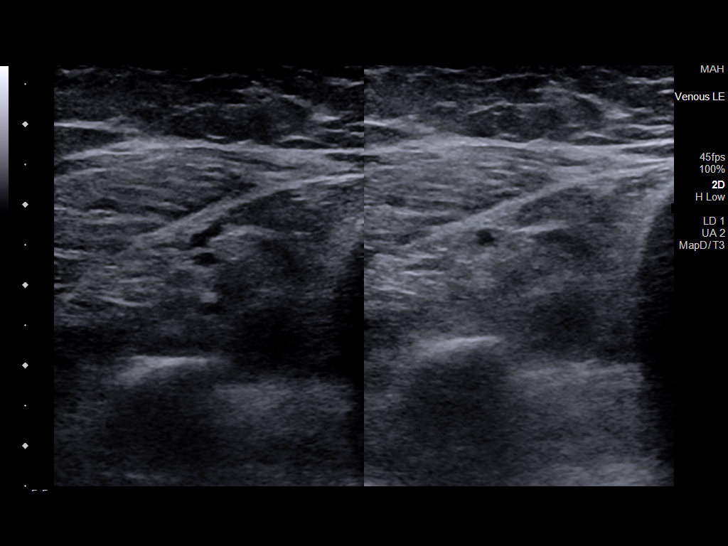
[im 41/41]
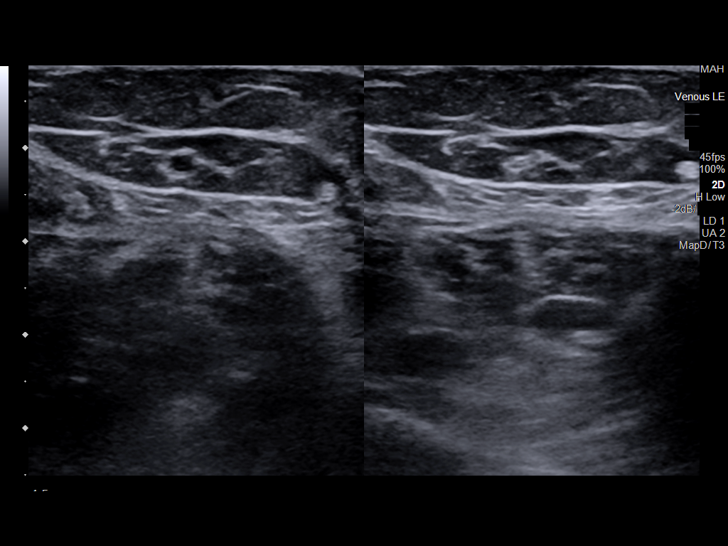

[14 of 24 positions shown; findings below may reference images not displayed]

FINDINGS: VENOUS

Normal compressibility of the common femoral, superficial femoral,
and popliteal veins, as well as the visualized calf veins.
Visualized portions of profunda femoral vein and great saphenous
vein unremarkable. No filling defects to suggest DVT on grayscale or
color Doppler imaging. Doppler waveforms show normal direction of
venous flow, normal respiratory plasticity and response to
augmentation.

Limited views of the contralateral common femoral vein are
unremarkable.

OTHER

None.

Limitations: none
IMPRESSION: Negative.

## 2022-11-15 ENCOUNTER — Other Ambulatory Visit: Payer: Self-pay | Admitting: Family Medicine

## 2022-11-15 DIAGNOSIS — I10 Essential (primary) hypertension: Secondary | ICD-10-CM

## 2022-11-15 LAB — HM DIABETES EYE EXAM

## 2022-11-16 ENCOUNTER — Telehealth: Payer: Self-pay | Admitting: Family Medicine

## 2022-11-16 NOTE — Telephone Encounter (Signed)
Patient called stating her insurance no longer covers her prescription of RYBELSUS 7 MG TABS [[id number]] . She would like to know what are the alternatives.

## 2022-11-22 MED ORDER — TIRZEPATIDE 5 MG/0.5ML ~~LOC~~ SOAJ: 5.0000 mg | SUBCUTANEOUS | 0 refills | Status: DC

## 2022-11-22 NOTE — Telephone Encounter (Signed)
Rx for [facility] sent.   CM

## 2022-11-22 NOTE — Telephone Encounter (Signed)
Pt has been advised of alternatives. She is willing to try Dr. Ashley Royalty preference. She has been advised of PA requirements.

## 2023-04-12 ENCOUNTER — Ambulatory Visit: Payer: 59 | Admitting: Family Medicine

## 2023-04-18 ENCOUNTER — Ambulatory Visit: Payer: Managed Care, Other (non HMO) | Admitting: Family Medicine

## 2023-04-25 ENCOUNTER — Encounter: Payer: Self-pay | Admitting: Family Medicine

## 2023-04-25 ENCOUNTER — Ambulatory Visit: Payer: BC Managed Care – PPO | Admitting: Family Medicine

## 2023-04-25 VITALS — BP 126/85 | HR 100 | Ht 60.0 in | Wt 167.0 lb

## 2023-04-25 DIAGNOSIS — E119 Type 2 diabetes mellitus without complications: Secondary | ICD-10-CM | POA: Diagnosis not present

## 2023-04-25 DIAGNOSIS — Z1211 Encounter for screening for malignant neoplasm of colon: Secondary | ICD-10-CM | POA: Diagnosis not present

## 2023-04-25 DIAGNOSIS — I10 Essential (primary) hypertension: Secondary | ICD-10-CM | POA: Diagnosis not present

## 2023-04-25 DIAGNOSIS — Z1231 Encounter for screening mammogram for malignant neoplasm of breast: Secondary | ICD-10-CM

## 2023-04-25 DIAGNOSIS — E782 Mixed hyperlipidemia: Secondary | ICD-10-CM

## 2023-04-25 LAB — POCT GLYCOSYLATED HEMOGLOBIN (HGB A1C): HbA1c, POC (controlled diabetic range): 7 % (ref 0.0–7.0)

## 2023-04-25 LAB — POCT UA - MICROALBUMIN
Creatinine, POC: 10 mg/dL
Microalbumin Ur, POC: 10 mg/L

## 2023-04-25 MED ORDER — TIRZEPATIDE 5 MG/0.5ML ~~LOC~~ SOAJ: 5.0000 mg | SUBCUTANEOUS | 0 refills | Status: DC

## 2023-04-25 MED ORDER — TIRZEPATIDE 2.5 MG/0.5ML ~~LOC~~ SOAJ: SUBCUTANEOUS | 0 refills | Status: DC

## 2023-04-25 NOTE — Progress Notes (Signed)
Savannah Randall - 57 y.o. female MRN [id number]  Date of birth: Aug 21, 1965  Subjective Chief Complaint  Patient presents with   Hypertension   Diabetes    HPI Savannah Randall is a 57 y.o. female here today for follow up.   She reports that she is doing great  Doing well with metformin for management of diabetes.  Overall she is tolerating medications well.  She never started [facility].  A1c today is 7.0%.  Tolerating atorvastatin well for associated HLD.   She continues on amlodipine and chlorthalidone for management of HTN.  BP is well controlled with this.  Denies chest pain, shortness of breath, palpitations, headache or vision changes.    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     Social History   Socioeconomic History   Marital status: Married    Spouse name: Not on file   Number of children: Not on file   Years of education: Not on file   Highest education level: Not on file  Occupational History   Not on file  Tobacco Use   Smoking status: Never   Smokeless tobacco: Never  Vaping Use   Vaping status: Never Used  Substance and Sexual Activity   Alcohol use: Not Currently   Drug use: Never   Sexual activity: Yes    Birth control/protection: None  Other Topics Concern   Not on file  Social History Narrative   Not on file   Social Determinants of Health   Financial Resource Strain: Not on file  Food Insecurity: Not on file  Transportation Needs: Not on file  Physical Activity: Not on file  Stress: Not on file  Social Connections: Not on file    Family History  Problem Relation Age of Onset   Diabetes Mother     Heart disease Mother    Heart disease Father     Health Maintenance  Topic Date Due   HIV Screening  Never done   Diabetic kidney evaluation - Urine ACR  Never done   Hepatitis C Screening  Never done   Colonoscopy  Never done   MAMMOGRAM  01/21/2023   OPHTHALMOLOGY EXAM  01/28/2023   INFLUENZA VACCINE  09/04/2023 (Originally 01/05/2023)   Zoster Vaccines- Shingrix (1 of 2) 09/04/2023 (Originally 07/28/2013)   Diabetic kidney evaluation - eGFR measurement  08/08/2023   HEMOGLOBIN A1C  10/23/2023   FOOT EXAM  04/24/2024   Cervical Cancer Screening (HPV/Pap Cotest)  01/20/2026   DTaP/Tdap/Td (2 - Td or Tdap) 08/06/2031   HPV VACCINES  Aged Out   COVID-19 Vaccine  Discontinued     ----------------------------------------------------------------------------------------------------------------------------------------------------------------------------------------------------------------- Physical Exam BP 126/85 (BP Location: Left Arm, Patient Position: Sitting, Cuff Size: Normal)   Pulse 100   Ht 5' (1.524 m)   Wt 167 lb (75.8 kg)   SpO2 99%   BMI 32.61 kg/m   Physical Exam Constitutional:      Appearance: Normal appearance.  HENT:     Head: Normocephalic and atraumatic.  Cardiovascular:     Rate and Rhythm: Normal rate and regular rhythm.  Pulmonary:     Effort: Pulmonary  effort is normal.     Breath sounds: Normal breath sounds.  Neurological:     Mental Status: She is alert.  Psychiatric:        Mood and Affect: Mood normal.        Behavior: Behavior normal.     ------------------------------------------------------------------------------------------------------------------------------------------------------------------------------------------------------------------- Assessment and Plan  Type 2 diabetes mellitus without complication, without long-term current use of insulin (HCC) A1c is unchanged at 7.0%  Resending tirzepatide.  Continue metformin..    Essential hypertension BP remains well controlled.  Recommend continuation of current medications for management of HTN.    Mixed hyperlipidemia Continue atorvastatin.    Meds ordered this encounter  Medications   tirzepatide (MOUNJARO) 2.5 MG/0.5ML Pen    Sig: Inject weekly x4 weeks then increase to 5mg .    Dispense:  2 mL    Refill:  0   tirzepatide (MOUNJARO) 5 MG/0.5ML Pen    Sig: Inject 5 mg into the skin once a week.    Dispense:  6 mL    Refill:  0    Return in about 6 months (around 10/23/2023) for Hypertension, Type 2 Diabetes.    This visit occurred during the SARS-CoV-2 public health emergency.  Safety protocols were in place, including screening questions prior to the visit, additional usage of staff PPE, and extensive cleaning of exam room while observing appropriate contact time as indicated for disinfecting solutions.

## 2023-04-25 NOTE — Assessment & Plan Note (Signed)
Continue atorvastatin

## 2023-04-25 NOTE — Assessment & Plan Note (Signed)
A1c is unchanged at 7.0%  Resending tirzepatide.  Continue metformin.Marland Kitchen

## 2023-04-25 NOTE — Assessment & Plan Note (Signed)
BP remains well controlled.  Recommend continuation of current medications for management of HTN.

## 2023-04-29 ENCOUNTER — Other Ambulatory Visit: Payer: Self-pay | Admitting: Family Medicine

## 2023-04-29 DIAGNOSIS — I10 Essential (primary) hypertension: Secondary | ICD-10-CM

## 2023-05-01 ENCOUNTER — Telehealth: Payer: Self-pay

## 2023-05-01 DIAGNOSIS — I10 Essential (primary) hypertension: Secondary | ICD-10-CM

## 2023-05-01 MED ORDER — AMLODIPINE BESYLATE 10 MG PO TABS: 10.0000 mg | ORAL_TABLET | Freq: Every day | ORAL | 0 refills | Status: AC

## 2023-05-01 NOTE — Telephone Encounter (Signed)
Task completed. Rx refill sent to the preferred pharmacy.

## 2023-05-01 NOTE — Telephone Encounter (Signed)
Copied from CRM [id number]. Topic: Clinical - Medication Refill >> May 01, 2023 10:26 AM Dondra Prader A wrote: Most Recent Primary Care Visit:  Provider: Everrett Coombe  Department: [facility]  Visit Type: OFFICE VISIT  Date: 04/25/2023  Medication: amLODipine (NORVASC) 10 MG tablet  Has the patient contacted their pharmacy? Yes (Agent: If no, request that the patient contact the pharmacy for the refill. If patient does not wish to contact the pharmacy document the reason why and proceed with request.) (Agent: If yes, when and what did the pharmacy advise?)  Is this the correct pharmacy for this prescription? Yes If no, delete pharmacy and type the correct one.  This is the patient's preferred pharmacy:  [facility] [address], [place] - [address] [place] [address] Phone: [phone] Fax: [phone]   Has the prescription been filled recently? No  Is the patient out of the medication? Yes  Has the patient been seen for an appointment in the last year OR does the patient have an upcoming appointment? Yes  Can we respond through MyChart? Yes  Agent: Please be advised that Rx refills may take up to 3 business days. We ask that you follow-up with your pharmacy.

## 2023-05-09 ENCOUNTER — Encounter: Payer: Self-pay | Admitting: *Deleted

## 2023-10-17 ENCOUNTER — Telehealth: Payer: Self-pay | Admitting: *Deleted

## 2023-10-17 ENCOUNTER — Telehealth: Payer: Self-pay

## 2023-10-17 ENCOUNTER — Other Ambulatory Visit: Payer: Self-pay | Admitting: *Deleted

## 2023-10-17 DIAGNOSIS — Z1211 Encounter for screening for malignant neoplasm of colon: Secondary | ICD-10-CM

## 2023-10-17 MED ORDER — NA SULFATE-K SULFATE-MG SULF 17.5-3.13-1.6 GM/177ML PO SOLN: 1.0000 | Freq: Once | ORAL | 0 refills | Status: AC

## 2023-10-17 NOTE — Telephone Encounter (Addendum)
 Patient called back and left a voicemail.   Message left for patient to return my call.

## 2023-10-17 NOTE — Telephone Encounter (Signed)
 Pt requesting call back to schedule colonoscopy.

## 2023-10-17 NOTE — Telephone Encounter (Signed)
 Message left for patient to return my call.

## 2023-10-17 NOTE — Telephone Encounter (Signed)
 Colonoscopy schedule with Dr Ole Berkeley on 11/23/2023

## 2023-10-17 NOTE — Telephone Encounter (Signed)
 Gastroenterology Pre-Procedure Review  Request Date: 11/23/2023 Requesting Physician: Dr. Ole Berkeley  PATIENT REVIEW QUESTIONS: The patient responded to the following health history questions as indicated:    1. Are you having any GI issues? no 2. Do you have a personal history of Polyps? no 3. Do you have a family history of Colon Cancer or Polyps? no 4. Diabetes Mellitus? no 5. Joint replacements in the past 12 months?no 6. Major health problems in the past 3 months?no 7. Any artificial heart valves, MVP, or defibrillator?no    MEDICATIONS & ALLERGIES:    Patient reports the following regarding taking any anticoagulation/antiplatelet therapy:   Plavix, Coumadin, Eliquis, Xarelto, Lovenox, Pradaxa, Brilinta, or Effient? no Aspirin? no  Patient confirms/reports the following medications:  Current Outpatient Medications  Medication Sig Dispense Refill   Accu-Chek Softclix Lancets lancets USE 1 TO CHECK GLUCOSE ONCE DAILY     amLODipine  (NORVASC ) 10 MG tablet Take 1 tablet (10 mg total) by mouth daily. 90 tablet 0   atorvastatin  (LIPITOR) 20 MG tablet Take 1 tablet (20 mg total) by mouth daily. 90 tablet 3   Blood Glucose Monitoring Suppl (ACCU-CHEK GUIDE) w/Device KIT See admin instructions.     chlorthalidone  (HYGROTON ) 25 MG tablet Take 0.5 tablets (12.5 mg total) by mouth daily. NO REFILLS. NEEDS A F/U W/PCP FOR BP CHECK. 90 tablet 0   ferrous sulfate  325 (65 FE) MG tablet Take 1 tablet (325 mg total) by mouth daily. 30 tablet 0   fexofenadine -pseudoephedrine (ALLEGRA-D ALLERGY & CONGESTION) 180-240 MG 24 hr tablet Take 1 tablet by mouth daily. (Patient taking differently: Take 1 tablet by mouth daily as needed (allergies).) 90 tablet 1   glucose blood (ONETOUCH VERIO) test strip USE AS DIRECTED 25 each 11   ibuprofen (ADVIL,MOTRIN) 200 MG tablet Take 400 mg by mouth every 6 (six) hours as needed for cramping.     methocarbamol  (ROBAXIN ) 500 MG tablet Take 1 tablet (500 mg total) by mouth  every 8 (eight) hours as needed for muscle spasms. 90 tablet 1   naproxen  (NAPROSYN ) 500 MG tablet Take 1 tablet (500 mg total) by mouth 2 (two) times daily as needed for mild pain. Take with food 30 tablet 1   No current facility-administered medications for this visit.    Patient confirms/reports the following allergies:  Allergies  Allergen Reactions   Benadryl Allergy [Diphenhydramine Hcl] Anaphylaxis and Hives   Mushroom Extract Complex (Obsolete) Anaphylaxis and Nausea Only    Can't be near them at all   Penicillins Hives    No orders of the defined types were placed in this encounter.   AUTHORIZATION INFORMATION Primary Insurance: 1D#: Group #:  Secondary Insurance: 1D#: Group #:  SCHEDULE INFORMATION: Date: 11/23/2023 Time: Location:  [facility]

## 2023-10-23 ENCOUNTER — Ambulatory Visit: Payer: BC Managed Care – PPO | Admitting: Family Medicine

## 2023-11-22 ENCOUNTER — Telehealth: Payer: Self-pay

## 2023-11-22 NOTE — Telephone Encounter (Signed)
 Pt requesting call back has question about procedure tomorrow

## 2023-11-23 ENCOUNTER — Ambulatory Visit: Admitting: Anesthesiology

## 2023-11-23 ENCOUNTER — Ambulatory Visit
Admission: RE | Admit: 2023-11-23 | Discharge: 2023-11-23 | Disposition: A | Discharge status: Home / Self Care | Attending: Gastroenterology | Admitting: Gastroenterology

## 2023-11-23 ENCOUNTER — Encounter: Payer: Self-pay | Admitting: Gastroenterology

## 2023-11-23 ENCOUNTER — Other Ambulatory Visit: Payer: Self-pay

## 2023-11-23 ENCOUNTER — Encounter
Admission: RE | Disposition: A | Discharge status: Home / Self Care | Payer: Self-pay | Source: Home / Self Care | Attending: Gastroenterology

## 2023-11-23 DIAGNOSIS — I10 Essential (primary) hypertension: Secondary | ICD-10-CM | POA: Insufficient documentation

## 2023-11-23 DIAGNOSIS — K635 Polyp of colon: Secondary | ICD-10-CM

## 2023-11-23 DIAGNOSIS — Z1211 Encounter for screening for malignant neoplasm of colon: Secondary | ICD-10-CM | POA: Diagnosis present

## 2023-11-23 DIAGNOSIS — D122 Benign neoplasm of ascending colon: Secondary | ICD-10-CM | POA: Diagnosis not present

## 2023-11-23 DIAGNOSIS — K641 Second degree hemorrhoids: Secondary | ICD-10-CM | POA: Diagnosis not present

## 2023-11-23 HISTORY — PX: COLONOSCOPY: SHX5424

## 2023-11-23 HISTORY — PX: POLYPECTOMY: SHX149

## 2023-11-23 SURGERY — COLONOSCOPY
Anesthesia: General

## 2023-11-23 MED ORDER — PROPOFOL 1000 MG/100ML IV EMUL
INTRAVENOUS | Status: AC
  Filled 2023-11-23: qty 100

## 2023-11-23 MED ORDER — LIDOCAINE HCL (CARDIAC) PF 100 MG/5ML IV SOSY
PREFILLED_SYRINGE | INTRAVENOUS | Status: DC | PRN
  Administered 2023-11-23: 50 mg via INTRAVENOUS

## 2023-11-23 MED ORDER — PROPOFOL 10 MG/ML IV BOLUS
INTRAVENOUS | Status: DC | PRN
  Administered 2023-11-23: 10 mg via INTRAVENOUS
  Administered 2023-11-23: 80 mg via INTRAVENOUS

## 2023-11-23 MED ORDER — SODIUM CHLORIDE 0.9 % IV SOLN: INTRAVENOUS | Status: DC

## 2023-11-23 MED ORDER — PROPOFOL 500 MG/50ML IV EMUL
INTRAVENOUS | Status: DC | PRN
  Administered 2023-11-23: 150 ug/kg/min via INTRAVENOUS

## 2023-11-23 NOTE — Transfer of Care (Signed)
 Immediate Anesthesia Transfer of Care Note  Patient: Savannah Randall  Procedure(s) Performed: COLONOSCOPY POLYPECTOMY, INTESTINE  Patient Location: Endoscopy Unit  Anesthesia Type:General  Level of Consciousness: drowsy and patient cooperative  Airway & Oxygen Therapy: Patient Spontanous Breathing and Patient connected to nasal cannula oxygen  Post-op Assessment: Report given to RN, Post -op Vital signs reviewed and stable, and Patient moving all extremities X 4  Post vital signs: Reviewed and stable  Last Vitals:  Vitals Value Taken Time  BP 124/65 11/23/23 11:38  Temp 36 C 11/23/23 11:38  Pulse 83 11/23/23 11:38  Resp 13 11/23/23 11:38  SpO2 95 % 11/23/23 11:38  Vitals shown include unfiled device data.  Last Pain:  Vitals:   11/23/23 1138  TempSrc: Temporal  PainSc: Asleep         Complications: No notable events documented.

## 2023-11-23 NOTE — H&P (Signed)
 Savannah Sink, MD [facility] [address], [address] [place], [place] [address] Phone: [phone] Fax : [phone]  Primary Care Physician:  Adela Holter, DO Primary Gastroenterologist:  Dr. Ole Berkeley  Pre-Procedure History & Physical: HPI:  Savannah Randall is a 58 y.o. female is here for a screening colonoscopy.   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 Authorizing Provider  amLODipine  (NORVASC ) 10 MG tablet Take 1 tablet (10 mg total) by mouth daily. 05/01/23  Yes Adela Holter, DO  chlorthalidone  (HYGROTON ) 25 MG tablet Take 0.5 tablets (12.5 mg total) by mouth daily. NO REFILLS. NEEDS A F/U W/PCP FOR BP CHECK. 07/18/22  Yes Adela Holter, DO  Accu-Chek Softclix Lancets lancets USE 1 TO CHECK GLUCOSE ONCE DAILY 04/21/22   [provider]  atorvastatin  (LIPITOR) 20 MG tablet Take 1 tablet (20 mg total) by mouth daily. 08/19/22   Adela Holter, DO  Blood Glucose Monitoring Suppl (ACCU-CHEK GUIDE) w/Device KIT See admin instructions. 04/21/22   [provider]  ferrous sulfate  325 (65 FE) MG tablet Take 1 tablet (325 mg total) by mouth daily. 08/09/18   Debbra Fairy, PA-C  fexofenadine -pseudoephedrine (ALLEGRA-D ALLERGY & CONGESTION) 180-240 MG 24 hr tablet Take 1 tablet by mouth daily. Patient taking differently: Take 1 tablet by mouth daily as needed (allergies). 09/16/19   Adela Holter, DO  glucose blood [facility] VERIO) test strip USE AS DIRECTED 04/21/22   Adela Holter, DO  ibuprofen (ADVIL,MOTRIN) 200 MG tablet Take 400 mg by mouth every 6 (six) hours as needed for cramping.    [provider]  methocarbamol  (ROBAXIN ) 500 MG tablet Take 1 tablet (500 mg total) by mouth every 8 (eight) hours as needed for muscle spasms. 04/21/22   Adela Holter, DO  naproxen  (NAPROSYN ) 500 MG tablet Take 1 tablet (500 mg total) by mouth 2 (two) times daily as needed for mild pain. Take with food 01/12/21   Adela Holter, DO    Allergies as of 10/18/2023 - Review Complete 04/25/2023  Allergen Reaction Noted   Benadryl allergy [diphenhydramine hcl] Anaphylaxis and Hives 08/09/2018   Mushroom extract complex (obsolete) Anaphylaxis and Nausea Only 08/09/2018   Penicillins Hives 08/09/2018    Family History  Problem Relation Age of Onset   Diabetes Mother    Heart disease Mother    Heart disease Father     Social History   Socioeconomic History   Marital status: Married    Spouse name: Not on file   Number of children: Not on file   Years of education: Not on file   Highest education level: Not on file  Occupational History   Not on file  Tobacco Use   Smoking status: Never   Smokeless tobacco: Never  Vaping Use   Vaping status: Never Used  Substance and Sexual Activity   Alcohol use: Not Currently   Drug use: Never   Sexual activity: Yes    Birth control/protection: None  Other Topics Concern   Not on file  Social History Narrative   Not on file   Social Drivers of Health   Financial Resource Strain: Not on file  Food Insecurity: Not on file  Transportation Needs: Not on file  Physical Activity: Not on file  Stress:  Not on file  Social Connections: Not on file  Intimate Partner Violence: Not on file    Review of Systems: See HPI, otherwise negative ROS  Physical Exam: BP (!) 146/100   Pulse 88   Temp (!) 97 F (36.1 C) (Temporal)   Resp 16   Ht 5' 1 (1.549 m)   Wt 78.5 kg   SpO2 99%   BMI 32.69 kg/m  General:   Alert,  pleasant and cooperative in NAD Head:  Normocephalic and atraumatic. Neck:  Supple; no masses or thyromegaly. Lungs:  Clear throughout to auscultation.    Heart:  Regular rate and rhythm. Abdomen:  Soft, nontender and nondistended. Normal bowel sounds, without guarding, and without  rebound.   Neurologic:  Alert and  oriented x4;  grossly normal neurologically.  Impression/Plan: Savannah Randall is now here to undergo a screening colonoscopy.  Risks, benefits, and alternatives regarding colonoscopy have been reviewed with the patient.  Questions have been answered.  All parties agreeable.

## 2023-11-23 NOTE — Op Note (Signed)
 [facility] Gastroenterology Patient Name: Savannah Randall Procedure Date: 11/23/2023 11:11 AM MRN: [id number] Account #: [id number] Date of Birth: 1966-03-31 Admit Type: Outpatient Age: 58 Room: [facility] ENDO ROOM 3 Gender: Female Note Status: Finalized Instrument Name: Hyman Main [id number] Procedure:             Colonoscopy Indications:           Screening for colorectal malignant neoplasm Providers:             Marnee Sink MD, MD Referring MD:          No Local Md, MD (Referring MD) Medicines:             Propofol per Anesthesia Complications:         No immediate complications. Procedure:             Pre-Anesthesia Assessment:                        - Prior to the procedure, a History and Physical was                         performed, and patient medications and allergies were                         reviewed. The patient's tolerance of previous                         anesthesia was also reviewed. The risks and benefits                         of the procedure and the sedation options and risks                         were discussed with the patient. All questions were                         answered, and informed consent was obtained. Prior                         Anticoagulants: The patient has taken no anticoagulant                         or antiplatelet agents. ASA Grade Assessment: II - A                         patient with mild systemic disease. After reviewing                         the risks and benefits, the patient was deemed in                         satisfactory condition to undergo the procedure.                        After obtaining informed consent, the colonoscope was                         passed under direct vision. Throughout the procedure,  the patient's blood pressure, pulse, and oxygen                         saturations were monitored continuously. The                         Colonoscope was introduced through  the anus and                         advanced to the the cecum, identified by appendiceal                         orifice and ileocecal valve. The colonoscopy was                         performed without difficulty. The patient tolerated                         the procedure well. The quality of the bowel                         preparation was excellent. Findings:      The perianal and digital rectal examinations were normal.      Two sessile polyps were found in the ascending colon. The polyps were 3       to 4 mm in size. These polyps were removed with a cold snare. Resection       and retrieval were complete.      A 4 mm polyp was found in the transverse colon. The polyp was sessile.       The polyp was removed with a cold snare. Resection and retrieval were       complete.      Non-bleeding internal hemorrhoids were found during retroflexion. The       hemorrhoids were Grade II (internal hemorrhoids that prolapse but reduce       spontaneously). Impression:            - Two 3 to 4 mm polyps in the ascending colon, removed                         with a cold snare. Resected and retrieved.                        - One 4 mm polyp in the transverse colon, removed with                         a cold snare. Resected and retrieved.                        - Non-bleeding internal hemorrhoids. Recommendation:        - Discharge patient to home.                        - Resume previous diet.                        - Continue present medications.                        - Await  pathology results.                        - If the pathology report reveals adenomatous tissue,                         then repeat the colonoscopy for surveillance in 5                         years. Procedure Code(s):     --- Professional ---                        [phone], Colonoscopy, flexible; with removal of                         tumor(s), polyp(s), or other lesion(s) by snare                          technique Diagnosis Code(s):     --- Professional ---                        Z12.11, Encounter for screening for malignant neoplasm                         of colon                        D12.2, Benign neoplasm of ascending colon CPT copyright 2022 American Medical Association. All rights reserved. The codes documented in this report are preliminary and upon coder review may  be revised to meet current compliance requirements. Marnee Sink MD, MD 11/23/2023 11:37:40 AM This report has been signed electronically. Number of Addenda: 0 Note Initiated On: 11/23/2023 11:11 AM Scope Withdrawal Time: 0 hours 7 minutes 0 seconds  Total Procedure Duration: 0 hours 12 minutes 26 seconds  Estimated Blood Loss:  Estimated blood loss: none.      [facility]

## 2023-11-23 NOTE — Anesthesia Preprocedure Evaluation (Addendum)
 Anesthesia Evaluation  Patient identified by MRN, date of birth, ID band Patient awake    Reviewed: Allergy & Precautions, H&P , NPO status , Patient's Chart, lab work & pertinent test results  Airway Mallampati: II  TM Distance: >3 FB Neck ROM: full    Dental no notable dental hx.    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      Reproductive/Obstetrics negative OB ROS                             Anesthesia Physical Anesthesia Plan  ASA: 2  Anesthesia Plan: General   Post-op Pain Management: Minimal or no pain anticipated   Induction: Intravenous  PONV Risk Score and Plan: Propofol infusion and TIVA  Airway Management Planned: Natural Airway  Additional Equipment:   Intra-op Plan:   Post-operative Plan:   Informed Consent: I have reviewed the patients History and Physical, chart, labs and discussed the procedure including the risks, benefits and alternatives for the proposed anesthesia with the patient or authorized representative who has indicated his/her understanding and acceptance.     Dental Advisory Given  Plan Discussed with: CRNA and Surgeon  Anesthesia Plan Comments:         Anesthesia Quick Evaluation

## 2023-11-24 ENCOUNTER — Ambulatory Visit: Payer: Self-pay | Admitting: Gastroenterology

## 2023-11-24 ENCOUNTER — Encounter: Payer: Self-pay | Admitting: Gastroenterology

## 2023-11-24 LAB — SURGICAL PATHOLOGY

## 2023-11-24 NOTE — Anesthesia Postprocedure Evaluation (Signed)
 Anesthesia Post Note  Patient: Savannah Randall  Procedure(s) Performed: COLONOSCOPY POLYPECTOMY, INTESTINE  Patient location during evaluation: PACU Anesthesia Type: General Level of consciousness: awake and alert Pain management: pain level controlled Vital Signs Assessment: post-procedure vital signs reviewed and stable Respiratory status: spontaneous breathing, nonlabored ventilation and respiratory function stable Cardiovascular status: blood pressure returned to baseline and stable Postop Assessment: no apparent nausea or vomiting Anesthetic complications: no   No notable events documented.   Last Vitals:  Vitals:   11/23/23 1159 11/23/23 1210  BP: 105/79 117/82  Pulse: 78 74  Resp: (!) 22 15  Temp:    SpO2: 100% 99%    Last Pain:  Vitals:   11/23/23 1210  TempSrc:   PainSc: 0-No pain                 Baltazar Bonier

## 2023-12-01 ENCOUNTER — Ambulatory Visit: Admitting: Family Medicine

## 2023-12-01 ENCOUNTER — Encounter: Payer: Self-pay | Admitting: Family Medicine

## 2023-12-01 VITALS — BP 107/74 | HR 87 | Ht 61.0 in | Wt 177.0 lb

## 2023-12-01 DIAGNOSIS — Z1231 Encounter for screening mammogram for malignant neoplasm of breast: Secondary | ICD-10-CM

## 2023-12-01 DIAGNOSIS — Z1159 Encounter for screening for other viral diseases: Secondary | ICD-10-CM | POA: Diagnosis not present

## 2023-12-01 DIAGNOSIS — I10 Essential (primary) hypertension: Secondary | ICD-10-CM | POA: Diagnosis not present

## 2023-12-01 DIAGNOSIS — Z7984 Long term (current) use of oral hypoglycemic drugs: Secondary | ICD-10-CM

## 2023-12-01 DIAGNOSIS — E119 Type 2 diabetes mellitus without complications: Secondary | ICD-10-CM | POA: Diagnosis not present

## 2023-12-01 DIAGNOSIS — E782 Mixed hyperlipidemia: Secondary | ICD-10-CM | POA: Diagnosis not present

## 2023-12-01 DIAGNOSIS — Z114 Encounter for screening for human immunodeficiency virus [HIV]: Secondary | ICD-10-CM

## 2023-12-01 LAB — POCT GLYCOSYLATED HEMOGLOBIN (HGB A1C): HbA1c, POC (controlled diabetic range): 7.2 % — AB (ref 0.0–7.0)

## 2023-12-01 MED ORDER — METFORMIN HCL ER 500 MG PO TB24: 500.0000 mg | ORAL_TABLET | Freq: Two times a day (BID) | ORAL | 1 refills | Status: AC

## 2023-12-01 NOTE — Assessment & Plan Note (Signed)
 A1c has increased.  Adding metformin  ER 500mg  BID.  F/u in 4 months.

## 2023-12-01 NOTE — Assessment & Plan Note (Signed)
 Continue atorvastatin 

## 2023-12-01 NOTE — Progress Notes (Signed)
 Savannah Randall - 58 y.o. female MRN [id number]  Date of birth: [birth year]  Subjective Chief Complaint  Patient presents with   Diabetes   Hypertension    HPI Savannah Randall is a 58 y.o. female here today for follow up visit.   She reports that she is doing pretty well.   BP is well controlled with combination of amlodipine  and chlorthalidone .  She denies side effects from medication at current strength.  She has not had chest pain, shortness of breath, palpitations, headache or vision changes.  Tolerating atorvastatin  for associated HLD.   A1c is increased some from last visit to 7.2%.  She has been trying to manage with diet.  She denies symptoms related to diabetes.   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    POLYPECTOMY  11/23/2023   Procedure: POLYPECTOMY, INTESTINE;  Surgeon: Jinny Carmine, MD;  Location: [facility];  Service: Endoscopy;;    Social History   Socioeconomic History   Marital status: Married    Spouse name: Not on file   Number of children: Not on file   Years of education: Not on file   Highest education level: Not on file  Occupational History   Not on file  Tobacco Use   Smoking status: Never   Smokeless tobacco: Never  Vaping Use   Vaping status: Never Used  Substance and Sexual Activity   Alcohol use: Not Currently   Drug use: Never   Sexual activity: Yes    Birth  control/protection: None  Other Topics Concern   Not on file  Social History Narrative   Not on file   Social Drivers of Health   Financial Resource Strain: Not on file  Food Insecurity: Not on file  Transportation Needs: Not on file  Physical Activity: Not on file  Stress: Not on file  Social Connections: Not on file    Family History  Problem Relation Age of Onset   Diabetes Mother    Heart disease Mother    Heart disease Father     Health Maintenance  Topic Date Due   HIV Screening  Never done   Hepatitis C Screening  Never done   Pneumococcal Vaccine 57-94 Years old (1 of 2 - PCV) Never done   Zoster Vaccines- Shingrix (1 of 2) Never done   MAMMOGRAM  01/21/2023   Diabetic kidney evaluation - eGFR measurement  08/08/2023   HEMOGLOBIN A1C  10/23/2023   OPHTHALMOLOGY EXAM  11/15/2023   INFLUENZA VACCINE  01/05/2024   Diabetic kidney evaluation - Urine ACR  04/24/2024   FOOT EXAM  04/24/2024   Cervical Cancer Screening (HPV/Pap Cotest)  01/20/2026   Colonoscopy  11/22/2028   DTaP/Tdap/Td (2 - Td or Tdap) 08/06/2031   Hepatitis B Vaccines  Aged Out   HPV VACCINES  Aged Out   Meningococcal B Vaccine  Aged Out  COVID-19 Vaccine  Discontinued     ----------------------------------------------------------------------------------------------------------------------------------------------------------------------------------------------------------------- Physical Exam BP 107/74 (BP Location: Right Arm, Patient Position: Sitting, Cuff Size: Normal)   Pulse 87   Ht 5' 1 (1.549 m)   Wt 177 lb (80.3 kg)   SpO2 98%   BMI 33.44 kg/m   Physical Exam Constitutional:      Appearance: Normal appearance.   Eyes:     General: No scleral icterus.   Cardiovascular:     Rate and Rhythm: Normal rate and regular rhythm.  Pulmonary:     Effort: Pulmonary effort is normal.     Breath sounds: Normal breath sounds.   Neurological:     Mental Status: She is alert.    Psychiatric:        Mood and Affect: Mood normal.        Behavior: Behavior normal.     ------------------------------------------------------------------------------------------------------------------------------------------------------------------------------------------------------------------- Assessment and Plan  Essential hypertension BP remains well controlled.  Recommend continuation of current medications for management of HTN.    Type 2 diabetes mellitus without complication, without long-term current use of insulin (HCC) A1c has increased.  Adding metformin  ER 500mg  BID.  F/u in 4 months.   Mixed hyperlipidemia Continue atorvastatin .    Meds ordered this encounter  Medications   metFORMIN  (GLUCOPHAGE -XR) 500 MG 24 hr tablet    Sig: Take 1 tablet (500 mg total) by mouth 2 (two) times daily with a meal.    Dispense:  180 tablet    Refill:  1    Return in about 4 months (around 04/01/2024) for Type 2 Diabetes, Hypertension.

## 2023-12-01 NOTE — Assessment & Plan Note (Signed)
BP remains well controlled.  Recommend continuation of current medications for management of HTN.

## 2023-12-02 LAB — MICROALBUMIN / CREATININE URINE RATIO
Creatinine, Urine: 46.3 mg/dL
Microalb/Creat Ratio: 10 mg/g{creat} (ref 0–29)
Microalbumin, Urine: 4.7 ug/mL

## 2023-12-02 LAB — CBC WITH DIFFERENTIAL/PLATELET
Basophils Absolute: 0.1 10*3/uL (ref 0.0–0.2)
Basos: 1 %
EOS (ABSOLUTE): 0.1 10*3/uL (ref 0.0–0.4)
Eos: 1 %
Hematocrit: 38 % (ref 34.0–46.6)
Hemoglobin: 12.5 g/dL (ref 11.1–15.9)
Immature Grans (Abs): 0 10*3/uL (ref 0.0–0.1)
Immature Granulocytes: 0 %
Lymphocytes Absolute: 1.1 10*3/uL (ref 0.7–3.1)
Lymphs: 11 %
MCH: 29.5 pg (ref 26.6–33.0)
MCHC: 32.9 g/dL (ref 31.5–35.7)
MCV: 90 fL (ref 79–97)
Monocytes Absolute: 0.6 10*3/uL (ref 0.1–0.9)
Monocytes: 6 %
Neutrophils Absolute: 8.6 10*3/uL — ABNORMAL HIGH (ref 1.4–7.0)
Neutrophils: 81 %
Platelets: 291 10*3/uL (ref 150–450)
RBC: 4.24 x10E6/uL (ref 3.77–5.28)
RDW: 13.2 % (ref 11.7–15.4)
WBC: 10.5 10*3/uL (ref 3.4–10.8)

## 2023-12-02 LAB — CMP14+EGFR
ALT: 22 IU/L (ref 0–32)
AST: 32 IU/L (ref 0–40)
Albumin: 4.2 g/dL (ref 3.8–4.9)
Alkaline Phosphatase: 113 IU/L (ref 44–121)
BUN/Creatinine Ratio: 13 (ref 12–28)
BUN: 12 mg/dL (ref 8–27)
Bilirubin Total: 0.3 mg/dL (ref 0.0–1.2)
CO2: 23 mmol/L (ref 20–29)
Calcium: 9.6 mg/dL (ref 8.7–10.3)
Chloride: 96 mmol/L (ref 96–106)
Creatinine, Ser: 0.92 mg/dL (ref 0.57–1.00)
Globulin, Total: 3.1 g/dL (ref 1.5–4.5)
Glucose: 131 mg/dL — ABNORMAL HIGH (ref 70–99)
Potassium: 3.9 mmol/L (ref 3.5–5.2)
Sodium: 138 mmol/L (ref 134–144)
Total Protein: 7.3 g/dL (ref 6.0–8.5)
eGFR: 71 mL/min/{1.73_m2} (ref >59)

## 2023-12-02 LAB — HIV ANTIBODY (ROUTINE TESTING W REFLEX): HIV Screen 4th Generation wRfx: NONREACTIVE

## 2023-12-02 LAB — LIPID PANEL
Chol/HDL Ratio: 5.5 ratio — ABNORMAL HIGH (ref 0.0–4.4)
Cholesterol, Total: 225 mg/dL — ABNORMAL HIGH (ref 100–199)
HDL: 41 mg/dL (ref >39)
LDL Chol Calc (NIH): 164 mg/dL — ABNORMAL HIGH (ref 0–99)
Triglycerides: 112 mg/dL (ref 0–149)
VLDL Cholesterol Cal: 20 mg/dL (ref 5–40)

## 2023-12-02 LAB — HEPATITIS C ANTIBODY: Hep C Virus Ab: NONREACTIVE

## 2023-12-10 ENCOUNTER — Ambulatory Visit: Payer: Self-pay | Admitting: Family Medicine

## 2023-12-29 ENCOUNTER — Other Ambulatory Visit: Payer: Self-pay | Admitting: Family Medicine

## 2024-01-02 ENCOUNTER — Telehealth: Payer: Self-pay

## 2024-01-02 NOTE — Telephone Encounter (Signed)
 Copied from CRM [id number]. Topic: General - Other >> Jan 02, 2024  9:09 AM Susanna ORN wrote: Reason for CRM: Patient called to get a message to Dr. Alvia. She states she's in need of a letter stating that her blood pressure is stable & controlled. 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 Please give her a call. CB #: [phone].

## 2024-01-03 NOTE — Telephone Encounter (Signed)
 Updated letter from last year and sent to covering Provider for clearance and signature.

## 2024-04-01 ENCOUNTER — Ambulatory Visit: Admitting: Family Medicine

## 2024-04-18 ENCOUNTER — Telehealth: Payer: Self-pay

## 2024-04-18 ENCOUNTER — Encounter: Payer: Self-pay | Admitting: Family Medicine

## 2024-04-18 NOTE — Telephone Encounter (Signed)
 Savannah Randall states she will call us  once she is back in [place]  for an order for a mammogram and office visit with Dr Alvia.
